# Patient Record
Sex: Male | Born: 1965
Health system: Southern US, Community
[De-identification: ages and names within clinical notes are randomized; demographics above are authoritative.]

## PROBLEM LIST (undated history)

## (undated) DIAGNOSIS — K219 Gastro-esophageal reflux disease without esophagitis: Secondary | ICD-10-CM

## (undated) DIAGNOSIS — C4491 Basal cell carcinoma of skin, unspecified: Secondary | ICD-10-CM

## (undated) DIAGNOSIS — Z973 Presence of spectacles and contact lenses: Secondary | ICD-10-CM

## (undated) HISTORY — DX: Basal cell carcinoma of skin, unspecified: C44.91

## (undated) HISTORY — PX: COLONOSCOPY: SHX174

---

## 2014-07-02 ENCOUNTER — Ambulatory Visit: Payer: Self-pay | Admitting: General Practice

## 2015-10-10 ENCOUNTER — Encounter: Payer: Self-pay | Admitting: Physician Assistant

## 2015-10-10 ENCOUNTER — Ambulatory Visit: Payer: Self-pay | Admitting: Physician Assistant

## 2015-10-10 VITALS — BP 110/70 | HR 65 | Temp 98.7°F | Ht 73.0 in | Wt 208.0 lb

## 2015-10-10 DIAGNOSIS — Z0000 Encounter for general adult medical examination without abnormal findings: Secondary | ICD-10-CM

## 2015-10-10 NOTE — Progress Notes (Signed)
S-  Patient for annual physical exam. No compliants. O-  HEENT unremarkable, Neck supple w/o adenopathy.  Lungs CTA, and Heart RRR. Abdomen with normoactive BS.Marland Kitchen Soft non-tender to palpation.  No cervical or lumbar spine deformity.  F/E ROM all extremities. CM-ll-Xll grossly intact. A.  Well exam.  P. Follow up post lab test.

## 2015-10-10 NOTE — Addendum Note (Signed)
Addended by: Rudene Anda T on: 10/10/2015 10:32 AM   Modules accepted: Orders

## 2015-10-11 LAB — CMP12+LP+TP+TSH+6AC+PSA+CBC…
A/G RATIO: 2.6 — AB (ref 1.1–2.5)
ALT: 14 IU/L (ref 0–44)
AST: 20 IU/L (ref 0–40)
Albumin: 4.5 g/dL (ref 3.5–5.5)
Alkaline Phosphatase: 63 IU/L (ref 39–117)
BASOS: 0 %
BILIRUBIN TOTAL: 0.3 mg/dL (ref 0.0–1.2)
BUN/Creatinine Ratio: 17 (ref 9–20)
BUN: 20 mg/dL (ref 6–24)
Basophils Absolute: 0 10*3/uL (ref 0.0–0.2)
CHOL/HDL RATIO: 3.6 ratio (ref 0.0–5.0)
CREATININE: 1.15 mg/dL (ref 0.76–1.27)
Calcium: 9.2 mg/dL (ref 8.7–10.2)
Chloride: 101 mmol/L (ref 96–106)
Cholesterol, Total: 203 mg/dL — ABNORMAL HIGH (ref 100–199)
EOS (ABSOLUTE): 0.2 10*3/uL (ref 0.0–0.4)
Eos: 3 %
Estimated CHD Risk: 0.6 times avg. (ref 0.0–1.0)
Free Thyroxine Index: 1.8 (ref 1.2–4.9)
GFR, EST AFRICAN AMERICAN: 85 mL/min/{1.73_m2} (ref >59)
GFR, EST NON AFRICAN AMERICAN: 73 mL/min/{1.73_m2} (ref >59)
GGT: 14 IU/L (ref 0–65)
GLUCOSE: 92 mg/dL (ref 65–99)
Globulin, Total: 1.7 g/dL (ref 1.5–4.5)
HDL: 56 mg/dL (ref >39)
HEMATOCRIT: 44.9 % (ref 37.5–51.0)
Hemoglobin: 14.9 g/dL (ref 12.6–17.7)
IMMATURE GRANS (ABS): 0 10*3/uL (ref 0.0–0.1)
Immature Granulocytes: 0 %
Iron: 68 ug/dL (ref 38–169)
LDH: 182 IU/L (ref 121–224)
LDL Calculated: 128 mg/dL — ABNORMAL HIGH (ref 0–99)
LYMPHS: 21 %
Lymphocytes Absolute: 1.3 10*3/uL (ref 0.7–3.1)
MCH: 27.6 pg (ref 26.6–33.0)
MCHC: 33.2 g/dL (ref 31.5–35.7)
MCV: 83 fL (ref 79–97)
MONOCYTES: 7 %
Monocytes Absolute: 0.5 10*3/uL (ref 0.1–0.9)
Neutrophils Absolute: 4.4 10*3/uL (ref 1.4–7.0)
Neutrophils: 69 %
PHOSPHORUS: 3.3 mg/dL (ref 2.5–4.5)
POTASSIUM: 4.9 mmol/L (ref 3.5–5.2)
Platelets: 205 10*3/uL (ref 150–379)
Prostate Specific Ag, Serum: 0.5 ng/mL (ref 0.0–4.0)
RBC: 5.4 x10E6/uL (ref 4.14–5.80)
RDW: 13.6 % (ref 12.3–15.4)
SODIUM: 142 mmol/L (ref 134–144)
T3 Uptake Ratio: 32 % (ref 24–39)
T4, Total: 5.7 ug/dL (ref 4.5–12.0)
TSH: 3.7 u[IU]/mL (ref 0.450–4.500)
Total Protein: 6.2 g/dL (ref 6.0–8.5)
Triglycerides: 95 mg/dL (ref 0–149)
URIC ACID: 6 mg/dL (ref 3.7–8.6)
VLDL CHOLESTEROL CAL: 19 mg/dL (ref 5–40)
WBC: 6.4 10*3/uL (ref 3.4–10.8)

## 2015-10-11 LAB — VITAMIN D 25 HYDROXY (VIT D DEFICIENCY, FRACTURES): Vit D, 25-Hydroxy: 29 ng/mL — ABNORMAL LOW (ref 30.0–100.0)

## 2015-10-13 ENCOUNTER — Telehealth: Payer: Self-pay | Admitting: Gastroenterology

## 2015-10-13 NOTE — Telephone Encounter (Signed)
colonoscopy

## 2015-10-16 ENCOUNTER — Encounter: Payer: Self-pay | Admitting: Emergency Medicine

## 2015-10-16 NOTE — Progress Notes (Signed)
A letter was sent to patient about his lab results and a referral for a routine colonoscopy was sent to [facility] to see Dr Allen Norris.

## 2015-10-24 ENCOUNTER — Telehealth: Payer: Self-pay

## 2015-10-24 ENCOUNTER — Other Ambulatory Visit: Payer: Self-pay

## 2015-10-24 DIAGNOSIS — Z1211 Encounter for screening for malignant neoplasm of colon: Secondary | ICD-10-CM

## 2015-10-24 MED ORDER — NA SULFATE-K SULFATE-MG SULF 17.5-3.13-1.6 GM/177ML PO SOLN: 1.0000 | ORAL | Status: DC

## 2015-10-24 NOTE — Telephone Encounter (Signed)
Gastroenterology Pre-Procedure Review  Request Date: 10/30/15 Requesting Physician: Dr. Rosanna Randy  PATIENT REVIEW QUESTIONS: The patient responded to the following health history questions as indicated:    1. Are you having any GI issues? no 2. Do you have a personal history of Polyps? yes (hyperplasic) 3. Do you have a family history of Colon Cancer or Polyps? no 4. Diabetes Mellitus? no 5. Joint replacements in the past 12 months?no 6. Major health problems in the past 3 months?no 7. Any artificial heart valves, MVP, or defibrillator?no    MEDICATIONS & ALLERGIES:    Patient reports the following regarding taking any anticoagulation/antiplatelet therapy:   Plavix, Coumadin, Eliquis, Xarelto, Lovenox, Pradaxa, Brilinta, or Effient? no Aspirin? no  Patient confirms/reports the following medications:  No current outpatient prescriptions on file.   No current facility-administered medications for this visit.    Patient confirms/reports the following allergies:  Allergies  Allergen Reactions  . Sulfa Antibiotics     No orders of the defined types were placed in this encounter.    AUTHORIZATION INFORMATION Primary Insurance: 1D#: Group #:  Secondary Insurance: 1D#: Group #:  SCHEDULE INFORMATION: Date: 10/30/15 Time: Location: [place]

## 2015-10-24 NOTE — Telephone Encounter (Signed)
Pt is scheduled for a screening colonoscopy at [facility] on 10/31/15. Instructions have been emailed and rx sent to pharmacy. Please get precert for procedure. Thank you!

## 2015-10-27 ENCOUNTER — Encounter: Payer: Self-pay | Admitting: *Deleted

## 2015-10-29 NOTE — Discharge Instructions (Signed)

## 2015-10-30 ENCOUNTER — Encounter: Payer: Self-pay | Admitting: *Deleted

## 2015-10-30 ENCOUNTER — Ambulatory Visit
Admission: RE | Admit: 2015-10-30 | Discharge: 2015-10-30 | Disposition: A | Discharge status: Home / Self Care | Payer: Managed Care, Other (non HMO) | Source: Ambulatory Visit | Attending: Gastroenterology | Admitting: Gastroenterology

## 2015-10-30 ENCOUNTER — Ambulatory Visit: Payer: Managed Care, Other (non HMO) | Admitting: Anesthesiology

## 2015-10-30 ENCOUNTER — Encounter
Admission: RE | Disposition: A | Discharge status: Home / Self Care | Payer: Self-pay | Source: Ambulatory Visit | Attending: Gastroenterology

## 2015-10-30 DIAGNOSIS — D120 Benign neoplasm of cecum: Secondary | ICD-10-CM | POA: Insufficient documentation

## 2015-10-30 DIAGNOSIS — Z1211 Encounter for screening for malignant neoplasm of colon: Secondary | ICD-10-CM | POA: Insufficient documentation

## 2015-10-30 DIAGNOSIS — D124 Benign neoplasm of descending colon: Secondary | ICD-10-CM | POA: Diagnosis not present

## 2015-10-30 DIAGNOSIS — Z79899 Other long term (current) drug therapy: Secondary | ICD-10-CM | POA: Insufficient documentation

## 2015-10-30 DIAGNOSIS — K219 Gastro-esophageal reflux disease without esophagitis: Secondary | ICD-10-CM | POA: Insufficient documentation

## 2015-10-30 DIAGNOSIS — K635 Polyp of colon: Secondary | ICD-10-CM | POA: Insufficient documentation

## 2015-10-30 DIAGNOSIS — Z882 Allergy status to sulfonamides status: Secondary | ICD-10-CM | POA: Insufficient documentation

## 2015-10-30 DIAGNOSIS — F17200 Nicotine dependence, unspecified, uncomplicated: Secondary | ICD-10-CM | POA: Diagnosis not present

## 2015-10-30 DIAGNOSIS — D125 Benign neoplasm of sigmoid colon: Secondary | ICD-10-CM | POA: Diagnosis not present

## 2015-10-30 HISTORY — PX: COLONOSCOPY WITH PROPOFOL: SHX5780

## 2015-10-30 HISTORY — PX: POLYPECTOMY: SHX5525

## 2015-10-30 HISTORY — DX: Presence of spectacles and contact lenses: Z97.3

## 2015-10-30 HISTORY — DX: Gastro-esophageal reflux disease without esophagitis: K21.9

## 2015-10-30 SURGERY — COLONOSCOPY WITH PROPOFOL
Anesthesia: Monitor Anesthesia Care | Wound class: Contaminated

## 2015-10-30 MED ORDER — PROPOFOL 10 MG/ML IV BOLUS
INTRAVENOUS | Status: DC | PRN
  Administered 2015-10-30: 20 mg via INTRAVENOUS
  Administered 2015-10-30: 80 mg via INTRAVENOUS
  Administered 2015-10-30: 20 mg via INTRAVENOUS
  Administered 2015-10-30: 30 mg via INTRAVENOUS
  Administered 2015-10-30: 20 mg via INTRAVENOUS
  Administered 2015-10-30: 40 mg via INTRAVENOUS
  Administered 2015-10-30: 20 mg via INTRAVENOUS

## 2015-10-30 MED ORDER — LACTATED RINGERS IV SOLN
INTRAVENOUS | Status: DC
  Administered 2015-10-30: 11:00:00 via INTRAVENOUS

## 2015-10-30 MED ORDER — STERILE WATER FOR IRRIGATION IR SOLN
Status: DC | PRN
  Administered 2015-10-30: 11:00:00

## 2015-10-30 MED ORDER — LIDOCAINE HCL (CARDIAC) 20 MG/ML IV SOLN
INTRAVENOUS | Status: DC | PRN
  Administered 2015-10-30: 50 mg via INTRAVENOUS

## 2015-10-30 SURGICAL SUPPLY — 28 items
CANISTER SUCT 1200ML W/VALVE (MISCELLANEOUS) ×4 IMPLANT
FCP ESCP3.2XJMB 240X2.8X (MISCELLANEOUS)
FORCEPS BIOP RAD 4 LRG CAP 4 (CUTTING FORCEPS) ×4 IMPLANT
FORCEPS BIOP RJ4 240 W/NDL (MISCELLANEOUS)
FORCEPS ESCP3.2XJMB 240X2.8X (MISCELLANEOUS) IMPLANT
GOWN CVR UNV OPN BCK APRN NK (MISCELLANEOUS) ×4 IMPLANT
GOWN ISOL THUMB LOOP REG UNIV (MISCELLANEOUS) ×4
HEMOCLIP INSTINCT (CLIP) IMPLANT
INJECTOR VARIJECT VIN23 (MISCELLANEOUS) IMPLANT
KIT CO2 TUBING (TUBING) IMPLANT
KIT DEFENDO VALVE AND CONN (KITS) IMPLANT
KIT ENDO PROCEDURE OLY (KITS) ×4 IMPLANT
LIGATOR MULTIBAND 6SHOOTER MBL (MISCELLANEOUS) IMPLANT
MARKER SPOT ENDO TATTOO 5ML (MISCELLANEOUS) IMPLANT
PAD GROUND ADULT SPLIT (MISCELLANEOUS) IMPLANT
SNARE SHORT THROW 13M SML OVAL (MISCELLANEOUS) IMPLANT
SNARE SHORT THROW 30M LRG OVAL (MISCELLANEOUS) IMPLANT
SPOT EX ENDOSCOPIC TATTOO (MISCELLANEOUS)
SUCTION POLY TRAP 4CHAMBER (MISCELLANEOUS) IMPLANT
TRAP SUCTION POLY (MISCELLANEOUS) IMPLANT
TUBING CONN 6MMX3.1M (TUBING)
TUBING SUCTION CONN 0.25 STRL (TUBING) IMPLANT
UNDERPAD 30X60 958B10 (PK) (MISCELLANEOUS) IMPLANT
VALVE BIOPSY ENDO (VALVE) IMPLANT
VARIJECT INJECTOR VIN23 (MISCELLANEOUS)
WATER AUXILLARY (MISCELLANEOUS) IMPLANT
WATER STERILE IRR 250ML POUR (IV SOLUTION) ×4 IMPLANT
WATER STERILE IRR 500ML POUR (IV SOLUTION) IMPLANT

## 2015-10-30 NOTE — Anesthesia Postprocedure Evaluation (Signed)
Anesthesia Post Note  Patient: Caleb Reyes  Procedure(s) Performed: Procedure(s) (LRB): COLONOSCOPY WITH PROPOFOL (N/A) POLYPECTOMY  Patient location during evaluation: PACU Anesthesia Type: MAC Level of consciousness: awake and alert and oriented Pain management: satisfactory to patient Vital Signs Assessment: post-procedure vital signs reviewed and stable Respiratory status: spontaneous breathing, nonlabored ventilation and respiratory function stable Cardiovascular status: blood pressure returned to baseline and stable Postop Assessment: Adequate PO intake and No signs of nausea or vomiting Anesthetic complications: no    Raliegh Ip

## 2015-10-30 NOTE — H&P (Signed)
  [facility]  [address] [place], [place] [address] Phone: [phone] Fax : [phone]  Primary Care Physician:  No primary care provider on file. Primary Gastroenterologist:  Dr. Allen Norris  Pre-Procedure History & Physical: HPI:  Caleb Reyes is a 51 y.o. male is here for a screening colonoscopy.   Past Medical History  Diagnosis Date  . GERD (gastroesophageal reflux disease)     no issues in over 5 yrs  . Wears contact lenses     Past Surgical History  Procedure Laterality Date  . Colonoscopy      Prior to Admission medications   Medication Sig Start Date End Date Taking? Authorizing Provider  FIBER PO Take by mouth daily.   Yes Historical Provider, MD  Multiple Vitamin (MULTIVITAMIN) tablet Take 1 tablet by mouth daily.   Yes Historical Provider, MD  Nutritional Supplements (JUICE PLUS FIBRE PO) Take by mouth daily.   Yes Historical Provider, MD  Na Sulfate-K Sulfate-Mg Sulf (SUPREP BOWEL PREP) SOLN Take 1 kit by mouth as directed. 10/24/15   Lucilla Lame, MD    Allergies as of 10/24/2015 - Review Complete 10/10/2015  Allergen Reaction Noted  . Sulfa antibiotics  10/24/2015    History reviewed. No pertinent family history.  Social History   Social History  . Marital Status: Married    Spouse Name: N/A  . Number of Children: N/A  . Years of Education: N/A   Occupational History  . Not on file.   Social History Main Topics  . Smoking status: Current Some Day Smoker    Types: Cigars  . Smokeless tobacco: Not on file     Comment: 1-2 cigars/wk  . Alcohol Use: 4.2 oz/week    0 Standard drinks or equivalent, 7 Cans of beer per week  . Drug Use: Not on file  . Sexual Activity: Not on file   Other Topics Concern  . Not on file   Social History Narrative    Review of Systems: See HPI, otherwise negative ROS  Physical Exam: Ht '6\' 1"'$  (1.854 m)  Wt 203 lb (92.08 kg)  BMI 26.79 kg/m2 General:   Alert,  pleasant and cooperative in  NAD Head:  Normocephalic and atraumatic. Neck:  Supple; no masses or thyromegaly. Lungs:  Clear throughout to auscultation.    Heart:  Regular rate and rhythm. Abdomen:  Soft, nontender and nondistended. Normal bowel sounds, without guarding, and without rebound.   Neurologic:  Alert and  oriented x4;  grossly normal neurologically.  Impression/Plan: Caleb Reyes is now here to undergo a screening colonoscopy.  Risks, benefits, and alternatives regarding colonoscopy have been reviewed with the patient.  Questions have been answered.  All parties agreeable.

## 2015-10-30 NOTE — Anesthesia Preprocedure Evaluation (Signed)
Anesthesia Evaluation  Patient identified by MRN, date of birth, ID band  Reviewed: Allergy & Precautions, H&P , NPO status , Patient's Chart, lab work & pertinent test results  Airway Mallampati: II  TM Distance: >3 FB Neck ROM: full    Dental no notable dental hx.    Pulmonary Current Smoker,    Pulmonary exam normal        Cardiovascular  Rhythm:regular Rate:Normal     Neuro/Psych    GI/Hepatic GERD  ,  Endo/Other    Renal/GU      Musculoskeletal   Abdominal   Peds  Hematology   Anesthesia Other Findings   Reproductive/Obstetrics                             Anesthesia Physical Anesthesia Plan  ASA: II  Anesthesia Plan: MAC   Post-op Pain Management:    Induction:   Airway Management Planned:   Additional Equipment:   Intra-op Plan:   Post-operative Plan:   Informed Consent: I have reviewed the patients History and Physical, chart, labs and discussed the procedure including the risks, benefits and alternatives for the proposed anesthesia with the patient or authorized representative who has indicated his/her understanding and acceptance.     Plan Discussed with: CRNA  Anesthesia Plan Comments:         Anesthesia Quick Evaluation

## 2015-10-30 NOTE — Transfer of Care (Signed)
Immediate Anesthesia Transfer of Care Note  Patient: Caleb Reyes  Procedure(s) Performed: Procedure(s) with comments: COLONOSCOPY WITH PROPOFOL (N/A) - PER PT AND GINGER PT WANTS TO ARRIVE AFTER 10 POLYPECTOMY  Patient Location: PACU  Anesthesia Type: MAC  Level of Consciousness: awake, alert  and patient cooperative  Airway and Oxygen Therapy: Patient Spontanous Breathing and Patient connected to supplemental oxygen  Post-op Assessment: Post-op Vital signs reviewed, Patient's Cardiovascular Status Stable, Respiratory Function Stable, Patent Airway and No signs of Nausea or vomiting  Post-op Vital Signs: Reviewed and stable  Complications: No apparent anesthesia complications

## 2015-10-30 NOTE — Anesthesia Procedure Notes (Signed)
Procedure Name: MAC Performed by: Ethyl Vila Pre-anesthesia Checklist: Patient identified, Emergency Drugs available, Suction available, Timeout performed and Patient being monitored Patient Re-evaluated:Patient Re-evaluated prior to inductionOxygen Delivery Method: Nasal cannula Placement Confirmation: positive ETCO2       

## 2015-10-30 NOTE — Op Note (Signed)
[facility] Patient Name: Caleb Reyes Procedure Date: 10/30/2015 11:06 AM MRN: [id number] Account #: [id number] Date of Birth: [birth year] Admit Type: Outpatient Age: 50 Room: [facility] OR ROOM 01 Gender: Male Note Status: Finalized Procedure:            Colonoscopy Indications:          Screening for colorectal malignant neoplasm Providers:            Lucilla Lame, MD Medicines:            Propofol per Anesthesia Complications:        No immediate complications. Procedure:            Pre-Anesthesia Assessment:                       - Prior to the procedure, a History and Physical was                        performed, and patient medications and allergies were                        reviewed. The patient's tolerance of previous                        anesthesia was also reviewed. The risks and benefits of                        the procedure and the sedation options and risks were                        discussed with the patient. All questions were                        answered, and informed consent was obtained. Prior                        Anticoagulants: The patient has taken no previous                        anticoagulant or antiplatelet agents. ASA Grade                        Assessment: II - A patient with mild systemic disease.                        After reviewing the risks and benefits, the patient was                        deemed in satisfactory condition to undergo the                        procedure.                       After obtaining informed consent, the colonoscope was                        passed under direct vision. Throughout the procedure,                        the patient's blood pressure, pulse,  and oxygen                        saturations were monitored continuously. The Olympus                        CF-HQ190L Colonoscope (S#. [id number]) was introduced                        through the anus and advanced to the the  cecum,                        identified by appendiceal orifice and ileocecal valve.                        The colonoscopy was performed without difficulty. The                        patient tolerated the procedure well. The quality of                        the bowel preparation was excellent. Findings:      The perianal and digital rectal examinations were normal.      A 3 mm polyp was found in the cecum. The polyp was sessile. The polyp       was removed with a cold biopsy forceps. Resection and retrieval were       complete.      A 3 mm polyp was found in the sigmoid colon. The polyp was sessile. The       polyp was removed with a cold biopsy forceps. Resection and retrieval       were complete.      A 5 mm polyp was found in the descending colon. The polyp was sessile.       The polyp was removed with a cold biopsy forceps. Resection and       retrieval were complete.      A 9 mm polyp was found in the descending colon. The polyp was sessile.       The polyp was removed with a cold snare. Resection and retrieval were       complete. Impression:           - One 3 mm polyp in the cecum, removed with a cold                        biopsy forceps. Resected and retrieved.                       - One 3 mm polyp in the sigmoid colon, removed with a                        cold biopsy forceps. Resected and retrieved.                       - One 5 mm polyp in the descending colon, removed with                        a cold biopsy forceps. Resected and retrieved.                       -  One 9 mm polyp in the descending colon, removed with                        a cold snare. Resected and retrieved. Recommendation:       - Await pathology results.                       - Repeat colonoscopy in 5 years if polyp adenoma and 10                        years if hyperplastic Procedure Code(s):    --- Professional ---                       [phone], Colonoscopy, flexible; with removal of tumor(s),                         polyp(s), or other lesion(s) by snare technique                       45380, 14, Colonoscopy, flexible; with biopsy, single                        or multiple Diagnosis Code(s):    --- Professional ---                       Z12.11, Encounter for screening for malignant neoplasm                        of colon                       D12.0, Benign neoplasm of cecum                       D12.5, Benign neoplasm of sigmoid colon                       D12.4, Benign neoplasm of descending colon CPT copyright 2016 American Medical Association. All rights reserved. The codes documented in this report are preliminary and upon coder review may  be revised to meet current compliance requirements. Lucilla Lame, MD 10/30/2015 11:29:36 AM This report has been signed electronically. Number of Addenda: 0 Note Initiated On: 10/30/2015 11:06 AM Scope Withdrawal Time: 0 hours 6 minutes 42 seconds  Total Procedure Duration: 0 hours 9 minutes 31 seconds       [facility]

## 2015-10-31 ENCOUNTER — Encounter: Payer: Self-pay | Admitting: Gastroenterology

## 2015-11-04 ENCOUNTER — Encounter: Payer: Self-pay | Admitting: Gastroenterology

## 2015-12-05 ENCOUNTER — Encounter: Payer: Self-pay | Admitting: Gastroenterology

## 2018-03-21 DIAGNOSIS — Z0000 Encounter for general adult medical examination without abnormal findings: Secondary | ICD-10-CM | POA: Diagnosis not present

## 2018-10-06 DIAGNOSIS — L570 Actinic keratosis: Secondary | ICD-10-CM | POA: Diagnosis not present

## 2018-10-06 DIAGNOSIS — L821 Other seborrheic keratosis: Secondary | ICD-10-CM | POA: Diagnosis not present

## 2018-10-06 DIAGNOSIS — C44519 Basal cell carcinoma of skin of other part of trunk: Secondary | ICD-10-CM | POA: Diagnosis not present

## 2018-10-06 DIAGNOSIS — C4491 Basal cell carcinoma of skin, unspecified: Secondary | ICD-10-CM

## 2018-10-06 DIAGNOSIS — L814 Other melanin hyperpigmentation: Secondary | ICD-10-CM | POA: Diagnosis not present

## 2018-10-06 DIAGNOSIS — D229 Melanocytic nevi, unspecified: Secondary | ICD-10-CM | POA: Diagnosis not present

## 2018-10-06 HISTORY — DX: Basal cell carcinoma of skin, unspecified: C44.91

## 2019-03-15 DIAGNOSIS — Z20828 Contact with and (suspected) exposure to other viral communicable diseases: Secondary | ICD-10-CM | POA: Diagnosis not present

## 2019-07-27 DIAGNOSIS — Z7689 Persons encountering health services in other specified circumstances: Secondary | ICD-10-CM | POA: Diagnosis not present

## 2019-07-27 DIAGNOSIS — Z23 Encounter for immunization: Secondary | ICD-10-CM | POA: Diagnosis not present

## 2019-07-27 DIAGNOSIS — F341 Dysthymic disorder: Secondary | ICD-10-CM | POA: Diagnosis not present

## 2019-08-14 DIAGNOSIS — Z20828 Contact with and (suspected) exposure to other viral communicable diseases: Secondary | ICD-10-CM | POA: Diagnosis not present

## 2019-08-14 DIAGNOSIS — Z833 Family history of diabetes mellitus: Secondary | ICD-10-CM | POA: Diagnosis not present

## 2019-08-14 DIAGNOSIS — Z83438 Family history of other disorder of lipoprotein metabolism and other lipidemia: Secondary | ICD-10-CM | POA: Diagnosis not present

## 2019-08-14 DIAGNOSIS — Z131 Encounter for screening for diabetes mellitus: Secondary | ICD-10-CM | POA: Diagnosis not present

## 2019-08-14 DIAGNOSIS — Z0000 Encounter for general adult medical examination without abnormal findings: Secondary | ICD-10-CM | POA: Diagnosis not present

## 2019-09-05 DIAGNOSIS — Z23 Encounter for immunization: Secondary | ICD-10-CM | POA: Diagnosis not present

## 2019-10-03 DIAGNOSIS — Z23 Encounter for immunization: Secondary | ICD-10-CM | POA: Diagnosis not present

## 2019-11-09 DIAGNOSIS — D1801 Hemangioma of skin and subcutaneous tissue: Secondary | ICD-10-CM | POA: Diagnosis not present

## 2019-11-09 DIAGNOSIS — Z1283 Encounter for screening for malignant neoplasm of skin: Secondary | ICD-10-CM | POA: Diagnosis not present

## 2019-11-09 DIAGNOSIS — L814 Other melanin hyperpigmentation: Secondary | ICD-10-CM | POA: Diagnosis not present

## 2019-11-09 DIAGNOSIS — C4441 Basal cell carcinoma of skin of scalp and neck: Secondary | ICD-10-CM | POA: Diagnosis not present

## 2019-11-09 DIAGNOSIS — Z85828 Personal history of other malignant neoplasm of skin: Secondary | ICD-10-CM | POA: Diagnosis not present

## 2019-11-30 ENCOUNTER — Other Ambulatory Visit: Payer: Self-pay

## 2019-11-30 ENCOUNTER — Ambulatory Visit: Payer: BC Managed Care – PPO | Admitting: Dermatology

## 2019-11-30 DIAGNOSIS — C4441 Basal cell carcinoma of skin of scalp and neck: Secondary | ICD-10-CM | POA: Diagnosis not present

## 2019-11-30 MED ORDER — MUPIROCIN 2 % EX OINT: 1.0000 "application " | TOPICAL_OINTMENT | Freq: Every day | CUTANEOUS | 1 refills | Status: DC

## 2019-11-30 NOTE — Patient Instructions (Addendum)
Wound Care Instructions  On the day following your surgery, you should begin doing daily dressing changes: 1. Remove the old dressing and discard it. 2. Cleanse the wound gently with tap water. This may be done in the shower or by placing a wet gauze pad directly on the wound and letting it soak for several minutes. 3. It is important to gently remove any dried blood from the wound in order to encourage healing. This may be done by gently rolling a moistened Q-tip on the dried blood. Do not pick at the wound. 4. If the wound should start to bleed, continue cleaning the wound, then place a moist gauze pad on the wound and hold pressure for a few minutes.  5. Make sure you then dry the skin surrounding the wound completely or the tape will not stick to the skin. Do not use cotton balls on the wound. 6. After the wound is clean and dry, apply the ointment gently with a Q-tip. 7. Cut a non-stick pad to fit the size of the wound. Lay the pad flush to the wound. If the wound is draining, you may want to reinforce it with a small amount of gauze on top of the non-stick pad for a little added compression to the area. 8. Use the tape to seal the area completely. 9. Select from the following with respect to your individual situation: 1. If your wound has been stitched closed: continue the above steps 1-8 at least daily until your sutures are removed. 2. If your wound has been left open to heal: continue steps 1-8 at least daily for the first 3-4 weeks. 10. We would like for you to take a few extra precautions for at least the next week. 1. Sleep with your head elevated on pillows if our wound is on your head. 2. Do not bend over or lift heavy items to reduce the chance of elevated blood pressure to the wound 3. Do not participate in particularly strenuous activities.   Below is a list of dressing supplies you might need.  . Cotton-tipped applicators - Q-tips . Gauze pads (2x2 and/or 4x4) - All-Purpose  Sponges . Non-stick dressing material - Telfa . Tape - Paper or Hypafix . New and clean tube of petroleum jelly - Vaseline    Comments on Post-Operative Period 1. Slight swelling and redness often appear around the wound. This is normal and will disappear within several days following the surgery. 2. The healing wound will drain a brownish-red-yellow discharge during healing. This is a normal phase of wound healing. As the wound begins to heal, the drainage may increase in amount. Again, this drainage is normal. 3. Notify us if the drainage becomes persistently bloody, excessively swollen, or intensely painful or develops a foul odor or red streaks.  4. If you should experience mild discomfort during the healing phase, you may take an aspirin-free medication such as Tylenol (acetaminophen). Notify us if the discomfort is severe or persistent. Avoid alcoholic beverages when taking pain medicine.  In Case of Wound Hemorrhage A wound hemorrhage is when the bandage suddenly becomes soaked with bright red blood and flows profusely. If this happens, sit down or lie down with your head elevated. If the wound has a dressing on it, do not remove the dressing. Apply pressure to the existing gauze. If the wound is not covered, use a gauze pad to apply pressure and continue applying the pressure for 20 minutes without peeking. DO NOT COVER THE WOUND WITH   A LARGE TOWEL OR [place] CLOTH. Release your hand from the wound site but do not remove the dressing. If the bleeding has stopped, gently clean around the wound. Leave the dressing in place for 24 hours if possible. This wait time allows the blood vessels to close off so that you do not spark a new round of bleeding by disrupting the newly clotted blood vessels with an immediate dressing change. If the bleeding does not subside, continue to hold pressure. If matters are out of your control, contact an After Hours clinic or go to the Emergency Room.  Marland Kitchenln

## 2019-11-30 NOTE — Progress Notes (Signed)
   Follow-Up Visit   Subjective  Caleb Reyes is a 54 y.o. male who presents for the following: Procedure (Excision of BCC at left neck).  The following portions of the chart were reviewed this encounter and updated as appropriate: Allergies  Meds  Problems  Med Hx  Surg Hx  Fam Hx      Review of Systems: No other skin or systemic complaints.  Objective  Well appearing patient in no apparent distress; mood and affect are within normal limits.  A focused examination was performed including face, neck. Relevant physical exam findings are noted in the Assessment and Plan.  Objective  Left Neck: Pink healing biopsy site, BX proven BCC.  Assessment & Plan  Basal cell carcinoma (BCC) of skin of neck Left Neck  Skin excision  Lesion length (cm):  1 Margin per side (cm):  0.4 Total excision diameter (cm):  1.8 Informed consent: discussed and consent obtained   Timeout: patient name, date of birth, surgical site, and procedure verified   Procedure prep:  Patient was prepped and draped in usual sterile fashion Prep type:  Chlorhexidine Anesthesia: the lesion was anesthetized in a standard fashion   Anesthetic:  1% lidocaine w/ epinephrine 1-100,000 nerve block Instrument used comment:  15c Hemostasis achieved with: pressure and electrodesiccation   Outcome: patient tolerated procedure well with no complications    Skin repair Complexity:  Complex Final length (cm):  4.7 Informed consent: discussed and consent obtained   Timeout: patient name, date of birth, surgical site, and procedure verified   Procedure prep:  Patient was prepped and draped in usual sterile fashion Prep type:  Chlorhexidine Anesthesia: the lesion was anesthetized in a standard fashion   Anesthesia comment:  13cc Anesthetic:  1% lidocaine w/ epinephrine 1-100,000 local infiltration Reason for type of repair: reduce tension to allow closure, reduce the risk of dehiscence, infection, and necrosis,  allow closure of the large defect, preserve normal anatomy, allow side-to-side closure without requiring a flap or graft and enhance both functionality and cosmetic results   Undermining: area extensively undermined   Subcutaneous layers (deep stitches):  Suture size:  3-0 Suture type: Vicryl (polyglactin 910)   Stitches:  Buried vertical mattress Fine/surface layer approximation (top stitches):  Suture size:  4-0 Suture type: Vicryl Rapide (coated polyglactin 910)   Stitches: simple running   Hemostasis achieved with: suture, pressure and electrodesiccation Hemostasis achieved with comment:  Vicryl Rapide, 3-0 Vicryl Outcome: patient tolerated procedure well with no complications   Post-procedure details: wound care instructions given   Additional details:  Extensive undermining greater than the maximum width of the defect along at least one entire edge of the defect was performed Maximum width of defect perpendicular to line of the closure 1.8 cm Width of undermining done 2.1 cm  Mupirocin and a pressure dressing applied  mupirocin ointment (BACTROBAN) 2 %  Specimen 1 - Surgical pathology Differential Diagnosis: BCC Check Margins: No Pink healing biopsy site, BX proven BCC [id number]  Return if symptoms worsen or fail to improve.   Graciella Belton, RMA, am acting as scribe for Forest Gleason, MD .  Documentation: I have reviewed the above documentation for accuracy and completeness, and I agree with the above.  Forest Gleason, MD

## 2019-12-03 ENCOUNTER — Telehealth: Payer: Self-pay

## 2019-12-03 NOTE — Telephone Encounter (Signed)
Left message to check on patient following Friday's surgery. Patient to call office if he has any questions or needs anything.

## 2019-12-05 ENCOUNTER — Encounter: Payer: Self-pay | Admitting: Dermatology

## 2019-12-05 NOTE — Progress Notes (Signed)
Skin (M), left neck RESIDUAL BASAL CELL CARCINOMA, MARGINS FREE "entire skin cancer is out" --> nothing else needs to be done. Keep 6 month full body skin exam appointment and call for any new or changing lesions before then.

## 2019-12-11 ENCOUNTER — Telehealth: Payer: Self-pay

## 2019-12-11 NOTE — Telephone Encounter (Signed)
Called patient to review results, no answer.

## 2019-12-11 NOTE — Telephone Encounter (Signed)
Unable to leave a message in regards to patient's pathology results.

## 2020-05-14 DIAGNOSIS — Z20828 Contact with and (suspected) exposure to other viral communicable diseases: Secondary | ICD-10-CM | POA: Diagnosis not present

## 2020-07-04 DIAGNOSIS — Z23 Encounter for immunization: Secondary | ICD-10-CM | POA: Diagnosis not present

## 2020-08-15 ENCOUNTER — Emergency Department: Payer: BC Managed Care – PPO

## 2020-08-15 ENCOUNTER — Encounter: Payer: Self-pay | Admitting: Emergency Medicine

## 2020-08-15 ENCOUNTER — Emergency Department
Admission: EM | Admit: 2020-08-15 | Discharge: 2020-08-15 | Disposition: A | Discharge status: Home / Self Care | Payer: BC Managed Care – PPO | Attending: Student in an Organized Health Care Education/Training Program | Admitting: Student in an Organized Health Care Education/Training Program

## 2020-08-15 ENCOUNTER — Other Ambulatory Visit: Payer: Self-pay

## 2020-08-15 DIAGNOSIS — Z85828 Personal history of other malignant neoplasm of skin: Secondary | ICD-10-CM | POA: Insufficient documentation

## 2020-08-15 DIAGNOSIS — K625 Hemorrhage of anus and rectum: Secondary | ICD-10-CM | POA: Diagnosis not present

## 2020-08-15 DIAGNOSIS — F17290 Nicotine dependence, other tobacco product, uncomplicated: Secondary | ICD-10-CM | POA: Diagnosis not present

## 2020-08-15 LAB — TYPE AND SCREEN
ABO/RH(D): O POS
Antibody Screen: NEGATIVE

## 2020-08-15 LAB — CBC
HCT: 44.2 % (ref 39.0–52.0)
Hemoglobin: 14.3 g/dL (ref 13.0–17.0)
MCH: 26.4 pg (ref 26.0–34.0)
MCHC: 32.4 g/dL (ref 30.0–36.0)
MCV: 81.7 fL (ref 80.0–100.0)
Platelets: 226 10*3/uL (ref 150–400)
RBC: 5.41 MIL/uL (ref 4.22–5.81)
RDW: 13.2 % (ref 11.5–15.5)
WBC: 6.6 10*3/uL (ref 4.0–10.5)
nRBC: 0 % (ref 0.0–0.2)

## 2020-08-15 LAB — COMPREHENSIVE METABOLIC PANEL
ALT: 16 U/L (ref 0–44)
AST: 19 U/L (ref 15–41)
Albumin: 4.2 g/dL (ref 3.5–5.0)
Alkaline Phosphatase: 68 U/L (ref 38–126)
Anion gap: 11 (ref 5–15)
BUN: 19 mg/dL (ref 6–20)
CO2: 28 mmol/L (ref 22–32)
Calcium: 9.1 mg/dL (ref 8.9–10.3)
Chloride: 99 mmol/L (ref 98–111)
Creatinine, Ser: 1.17 mg/dL (ref 0.61–1.24)
GFR, Estimated: >60 mL/min (ref >60)
Glucose, Bld: 92 mg/dL (ref 70–99)
Potassium: 4.4 mmol/L (ref 3.5–5.1)
Sodium: 138 mmol/L (ref 135–145)
Total Bilirubin: 0.7 mg/dL (ref 0.3–1.2)
Total Protein: 6.8 g/dL (ref 6.5–8.1)

## 2020-08-15 MED ORDER — IOHEXOL 9 MG/ML PO SOLN
500.0000 mL | ORAL | Status: AC
  Administered 2020-08-15: 500 mL via ORAL

## 2020-08-15 MED ORDER — IOHEXOL 300 MG/ML  SOLN
100.0000 mL | Freq: Once | INTRAMUSCULAR | Status: AC | PRN
  Administered 2020-08-15: 100 mL via INTRAVENOUS

## 2020-08-15 NOTE — ED Notes (Signed)
Rainbow with T&S was sent to lab.

## 2020-08-15 NOTE — ED Triage Notes (Signed)
Pt to ED via POV for rectal bleeding. Pt had 1 episode of bright red blood in the toilet when he used the restroom this morning. Pt states that there was not blood in the stool or on the tissue. Pt states that BM was not painful. Pt does have hx/o polyps. Pt is in NAD.

## 2020-08-15 NOTE — ED Provider Notes (Signed)
[facility] Emergency Department Provider Note    First MD Initiated Contact with Patient 08/15/20 1510     (approximate)  I have reviewed the triage vital signs and the nursing notes.   HISTORY  Chief Complaint Rectal Bleeding    HPI Caleb Reyes is a 54 y.o. male the below listed past medical history presents to the ER for evaluation of bright red blood per rectum occurred this morning.  Denies any pain.  Does have a history of colon polyps from colonoscopy several years ago.  Is not on blood thinners.  Did not have any pain.  Has not noted any melena or hematochezia.  States he typically is regular and goes #2 twice daily.  Denies any chest pain or pressure.  No indigestion.    Past Medical History:  Diagnosis Date  . Basal cell carcinoma    left lower back  . Basal cell carcinoma    left neck  . GERD (gastroesophageal reflux disease)    no issues in over 5 yrs  . Wears contact lenses    No family history on file. Past Surgical History:  Procedure Laterality Date  . COLONOSCOPY    . COLONOSCOPY WITH PROPOFOL N/A 10/30/2015   Procedure: COLONOSCOPY WITH PROPOFOL;  Surgeon: Lucilla Lame, MD;  Location: [place];  Service: Endoscopy;  Laterality: N/A;  PER PT AND GINGER PT WANTS TO ARRIVE AFTER 10  . POLYPECTOMY  10/30/2015   Procedure: POLYPECTOMY;  Surgeon: Lucilla Lame, MD;  Location: [place];  Service: Endoscopy;;   Patient Active Problem List   Diagnosis Date Noted  . Special screening for malignant neoplasms, colon   . Benign neoplasm of descending colon   . Benign neoplasm of cecum   . Benign neoplasm of sigmoid colon       Prior to Admission medications   Medication Sig Start Date End Date Taking? Authorizing Provider  FIBER PO Take by mouth daily.    [provider]  Multiple Vitamin (MULTIVITAMIN) tablet Take 1 tablet by mouth daily.    [provider]  mupirocin ointment (BACTROBAN) 2 %  Apply 1 application topically daily. With dressing changes 11/30/19   Laurence Ferrari, [place], MD  Na Sulfate-K Sulfate-Mg Sulf (SUPREP BOWEL PREP) SOLN Take 1 kit by mouth as directed. 10/24/15   Lucilla Lame, MD  Nutritional Supplements (JUICE PLUS FIBRE PO) Take by mouth daily.    [provider]    Allergies Sulfa antibiotics    Social History Social History   Tobacco Use  . Smoking status: Current Some Day Smoker    Types: Cigars  . Smokeless tobacco: Never Used  . Tobacco comment: 1-2 cigars/wk  Substance Use Topics  . Alcohol use: Yes    Alcohol/week: 7.0 standard drinks    Types: 7 Cans of beer per week  . Drug use: Not Currently    Review of Systems Patient denies headaches, rhinorrhea, blurry vision, numbness, shortness of breath, chest pain, edema, cough, abdominal pain, nausea, vomiting, diarrhea, dysuria, fevers, rashes or hallucinations unless otherwise stated above in HPI. ____________________________________________   PHYSICAL EXAM:  VITAL SIGNS: Vitals:   08/15/20 1723 08/15/20 1730  BP: 138/90 133/81  Pulse: (!) 56 65  Resp: 16 16  Temp: 98 F (36.7 C)   SpO2: 99% 97%    Constitutional: Alert and oriented.  Eyes: Conjunctivae are normal.  Head: Atraumatic. Nose: No congestion/rhinnorhea. Mouth/Throat: Mucous membranes are moist.   Neck: No stridor. Painless ROM.  Cardiovascular: Normal rate, regular rhythm. Grossly normal heart sounds.  Good peripheral circulation. Respiratory: Normal respiratory effort.  No retractions. Lungs CTAB. Gastrointestinal: Soft and nontender. No distention. No abdominal bruits. No CVA tenderness. Genitourinary: Normal external genitalia no identifiable mass.  Stool was guaiac negative no melena hematochezia on DRE. Musculoskeletal: No lower extremity tenderness nor edema.  No joint effusions. Neurologic:  Normal speech and language. No gross focal neurologic deficits are appreciated. No facial droop Skin:  Skin is  warm, dry and intact. No rash noted. Psychiatric: Mood and affect are normal. Speech and behavior are normal.  ____________________________________________   LABS (all labs ordered are listed, but only abnormal results are displayed)  Results for orders placed or performed during the hospital encounter of 08/15/20 (from the past 24 hour(s))  Comprehensive metabolic panel     Status: None   Collection Time: 08/15/20 12:09 PM  Result Value Ref Range   Sodium 138 135 - 145 mmol/L   Potassium 4.4 3.5 - 5.1 mmol/L   Chloride 99 98 - 111 mmol/L   CO2 28 22 - 32 mmol/L   Glucose, Bld 92 70 - 99 mg/dL   BUN 19 6 - 20 mg/dL   Creatinine, Ser 1.17 0.61 - 1.24 mg/dL   Calcium 9.1 8.9 - 10.3 mg/dL   Total Protein 6.8 6.5 - 8.1 g/dL   Albumin 4.2 3.5 - 5.0 g/dL   AST 19 15 - 41 U/L   ALT 16 0 - 44 U/L   Alkaline Phosphatase 68 38 - 126 U/L   Total Bilirubin 0.7 0.3 - 1.2 mg/dL   GFR, Estimated >60 >60 mL/min   Anion gap 11 5 - 15  CBC     Status: None   Collection Time: 08/15/20 12:09 PM  Result Value Ref Range   WBC 6.6 4.0 - 10.5 K/uL   RBC 5.41 4.22 - 5.81 MIL/uL   Hemoglobin 14.3 13.0 - 17.0 g/dL   HCT 44.2 39 - 52 %   MCV 81.7 80.0 - 100.0 fL   MCH 26.4 26.0 - 34.0 pg   MCHC 32.4 30.0 - 36.0 g/dL   RDW 13.2 11.5 - 15.5 %   Platelets 226 150 - 400 K/uL   nRBC 0.0 0.0 - 0.2 %  Type and screen [place]     Status: None   Collection Time: 08/15/20 12:09 PM  Result Value Ref Range   ABO/RH(D) O POS    Antibody Screen NEG    Sample Expiration      08/18/2020,2359 Performed at [facility], [address] [place], [place] [address]    ____________________________________________ ____________________________________________  RADIOLOGY  I personally reviewed all radiographic images ordered to evaluate for the above acute complaints and reviewed radiology reports and findings.  These findings were personally discussed with the patient.  Please  see medical record for radiology report.  ____________________________________________   PROCEDURES  Procedure(s) performed:  Procedures    Critical Care performed: no ____________________________________________   INITIAL IMPRESSION / ASSESSMENT AND PLAN / ED COURSE  Pertinent labs & imaging results that were available during my care of the patient were reviewed by me and considered in my medical decision making (see chart for details).   DDX: Hemorrhoid, diverticulosis, mass, anal fissure, upper GI bleed  Caleb Reyes is a 54 y.o. who presents to the ED with presentation as described above. Patient arrives well-appearing nontoxic afebrile hemodynamically stable. He is not on any blood thinners. Is exam is reassuring  is guaiac negative no active bleeding. His history is complicated by report of polyps on previous colonoscopy given his symptoms will order CT imaging to rule out mass diverticular disease. Will observe here in the ER. His hemoglobin is stable.  Clinical Course as of Aug 15 1853  Fri Aug 15, 2020  1851 Patient reassessed. CT imaging is reassuring. Is not had any additional episodes. He is well-appearing nontoxic. With all only one episode with negative guaiac is reassuring rectal exam reassuring imaging normal hemoglobin I believe he stable and appropriate for close outpatient follow-up. Patient agreeable to plan.  Discussed strict return precautions.   [PR]    Clinical Course User Index [PR] Merlyn Lot, MD    The patient was evaluated in Emergency Department today for the symptoms described in the history of present illness. He/she was evaluated in the context of the global COVID-19 pandemic, which necessitated consideration that the patient might be at risk for infection with the SARS-CoV-2 virus that causes COVID-19. Institutional protocols and algorithms that pertain to the evaluation of patients at risk for COVID-19 are in a state of rapid change based on  information released by regulatory bodies including the CDC and federal and state organizations. These policies and algorithms were followed during the patient's care in the ED.  As part of my medical decision making, I reviewed the following data within the [place] notes reviewed and incorporated, Labs reviewed, notes from prior ED visits and [place] Controlled Substance Database   ____________________________________________   FINAL CLINICAL IMPRESSION(S) / ED DIAGNOSES  Final diagnoses:  Rectal bleeding      NEW MEDICATIONS STARTED DURING THIS VISIT:  New Prescriptions   No medications on file     Note:  This document was prepared using Dragon voice recognition software and may include unintentional dictation errors.    Merlyn Lot, MD 08/15/20 [phone]

## 2020-08-15 NOTE — ED Notes (Signed)
PO contrast completed -- CT notified

## 2020-08-18 ENCOUNTER — Other Ambulatory Visit: Payer: Self-pay

## 2020-08-18 ENCOUNTER — Telehealth (INDEPENDENT_AMBULATORY_CARE_PROVIDER_SITE_OTHER): Payer: Self-pay | Admitting: Gastroenterology

## 2020-08-18 DIAGNOSIS — Z86010 Personal history of colonic polyps: Secondary | ICD-10-CM

## 2020-08-18 MED ORDER — PEG 3350-KCL-NA BICARB-NACL 420 G PO SOLR: 4000.0000 mL | Freq: Once | ORAL | 0 refills | Status: AC

## 2020-08-18 NOTE — Progress Notes (Signed)
Gastroenterology Pre-Procedure Review  Request Date: Tuesday 09/09/20 Requesting Physician: Dr. Wohl  PATIENT REVIEW QUESTIONS: The patient responded to the following health history questions as indicated:    1. Are you having any GI issues? ER Visit 08/15/20 Rectal Bleed.  ER suspected polyp was the cause of bleed. 2. Do you have a personal history of Polyps? yes (10/30/15 colon polyps noted performed by Dr. Wohl) 3. Do you have a family history of Colon Cancer or Polyps? yes (Dad (living) colon cancer) 4. Diabetes Mellitus? no 5. Joint replacements in the past 12 months?no 6. Major health problems in the past 3 months?no 7. Any artificial heart valves, MVP, or defibrillator?no    MEDICATIONS & ALLERGIES:    Patient reports the following regarding taking any anticoagulation/antiplatelet therapy:   Plavix, Coumadin, Eliquis, Xarelto, Lovenox, Pradaxa, Brilinta, or Effient? no Aspirin? no  Patient confirms/reports the following medications:  Current Outpatient Medications  Medication Sig Dispense Refill  . FIBER PO Take by mouth daily.    . Multiple Vitamin (MULTIVITAMIN) tablet Take 1 tablet by mouth daily.    . mupirocin ointment (BACTROBAN) 2 % Apply 1 application topically daily. With dressing changes (Patient not taking: Reported on 08/18/2020) 22 g 1  . Na Sulfate-K Sulfate-Mg Sulf (SUPREP BOWEL PREP) SOLN Take 1 kit by mouth as directed. (Patient not taking: Reported on 08/18/2020) 1 Bottle 0  . Nutritional Supplements (JUICE PLUS FIBRE PO) Take by mouth daily. (Patient not taking: Reported on 08/18/2020)     No current facility-administered medications for this visit.    Patient confirms/reports the following allergies:  Allergies  Allergen Reactions  . Sulfa Antibiotics Rash    No orders of the defined types were placed in this encounter.   AUTHORIZATION INFORMATION Primary Insurance: 1D#: Group #:  Secondary Insurance: 1D#: Group #:  SCHEDULE INFORMATION:  Date: Tuesday 09/09/20 Time: Location:[facility] 

## 2020-09-05 ENCOUNTER — Other Ambulatory Visit
Admission: RE | Admit: 2020-09-05 | Discharge: 2020-09-05 | Disposition: A | Discharge status: Home / Self Care | Payer: BC Managed Care – PPO | Source: Ambulatory Visit | Attending: Gastroenterology | Admitting: Gastroenterology

## 2020-09-05 ENCOUNTER — Other Ambulatory Visit: Payer: Self-pay

## 2020-09-05 DIAGNOSIS — Z20822 Contact with and (suspected) exposure to covid-19: Secondary | ICD-10-CM | POA: Diagnosis not present

## 2020-09-05 DIAGNOSIS — Z01812 Encounter for preprocedural laboratory examination: Secondary | ICD-10-CM | POA: Insufficient documentation

## 2020-09-05 LAB — SARS CORONAVIRUS 2 (TAT 6-24 HRS): SARS Coronavirus 2: NEGATIVE

## 2020-09-08 ENCOUNTER — Encounter: Payer: Self-pay | Admitting: Gastroenterology

## 2020-09-09 ENCOUNTER — Ambulatory Visit: Payer: BC Managed Care – PPO | Admitting: Certified Registered"

## 2020-09-09 ENCOUNTER — Encounter: Payer: Self-pay | Admitting: Gastroenterology

## 2020-09-09 ENCOUNTER — Encounter
Admission: RE | Disposition: A | Discharge status: Home / Self Care | Payer: Self-pay | Source: Home / Self Care | Attending: Gastroenterology

## 2020-09-09 ENCOUNTER — Ambulatory Visit
Admission: RE | Admit: 2020-09-09 | Discharge: 2020-09-09 | Disposition: A | Discharge status: Home / Self Care | Payer: BC Managed Care – PPO | Attending: Gastroenterology | Admitting: Gastroenterology

## 2020-09-09 ENCOUNTER — Other Ambulatory Visit: Payer: Self-pay

## 2020-09-09 DIAGNOSIS — F17290 Nicotine dependence, other tobacco product, uncomplicated: Secondary | ICD-10-CM | POA: Insufficient documentation

## 2020-09-09 DIAGNOSIS — Z8719 Personal history of other diseases of the digestive system: Secondary | ICD-10-CM | POA: Diagnosis not present

## 2020-09-09 DIAGNOSIS — D125 Benign neoplasm of sigmoid colon: Secondary | ICD-10-CM | POA: Diagnosis not present

## 2020-09-09 DIAGNOSIS — Z85828 Personal history of other malignant neoplasm of skin: Secondary | ICD-10-CM | POA: Insufficient documentation

## 2020-09-09 DIAGNOSIS — Z882 Allergy status to sulfonamides status: Secondary | ICD-10-CM | POA: Diagnosis not present

## 2020-09-09 DIAGNOSIS — Z1211 Encounter for screening for malignant neoplasm of colon: Secondary | ICD-10-CM | POA: Diagnosis not present

## 2020-09-09 DIAGNOSIS — Z860100 Personal history of colon polyps, unspecified: Secondary | ICD-10-CM

## 2020-09-09 DIAGNOSIS — K635 Polyp of colon: Secondary | ICD-10-CM | POA: Diagnosis not present

## 2020-09-09 DIAGNOSIS — K648 Other hemorrhoids: Secondary | ICD-10-CM | POA: Diagnosis not present

## 2020-09-09 HISTORY — PX: COLONOSCOPY WITH PROPOFOL: SHX5780

## 2020-09-09 SURGERY — COLONOSCOPY WITH PROPOFOL
Anesthesia: General

## 2020-09-09 MED ORDER — PROPOFOL 500 MG/50ML IV EMUL
INTRAVENOUS | Status: DC | PRN
  Administered 2020-09-09: 165 ug/kg/min via INTRAVENOUS

## 2020-09-09 MED ORDER — SODIUM CHLORIDE 0.9 % IV SOLN: INTRAVENOUS | Status: DC

## 2020-09-09 MED ORDER — LIDOCAINE HCL (CARDIAC) PF 100 MG/5ML IV SOSY
PREFILLED_SYRINGE | INTRAVENOUS | Status: DC | PRN
  Administered 2020-09-09: 100 mg via INTRAVENOUS

## 2020-09-09 MED ORDER — PROPOFOL 10 MG/ML IV BOLUS
INTRAVENOUS | Status: DC | PRN
  Administered 2020-09-09 (×2): 20 mg via INTRAVENOUS
  Administered 2020-09-09: 60 mg via INTRAVENOUS

## 2020-09-09 MED ORDER — EPHEDRINE SULFATE 50 MG/ML IJ SOLN: INTRAMUSCULAR | Status: DC | PRN

## 2020-09-09 MED ORDER — GLYCOPYRROLATE 0.2 MG/ML IJ SOLN
INTRAMUSCULAR | Status: DC | PRN
  Administered 2020-09-09: .2 mg via INTRAVENOUS

## 2020-09-09 NOTE — Transfer of Care (Signed)
Immediate Anesthesia Transfer of Care Note  Patient: Caleb Reyes  Procedure(s) Performed: COLONOSCOPY WITH PROPOFOL (N/A )  Patient Location: Endoscopy Unit  Anesthesia Type:General  Level of Consciousness: drowsy, patient cooperative and responds to stimulation  Airway & Oxygen Therapy: Patient Spontanous Breathing and Patient connected to face mask oxygen  Post-op Assessment: Report given to RN and Post -op Vital signs reviewed and stable  Post vital signs: Reviewed and stable  Last Vitals:  Vitals Value Taken Time  BP    Temp    Pulse    Resp    SpO2      Last Pain:  Vitals:   09/09/20 0911  TempSrc: Temporal  PainSc: 0-No pain         Complications: No complications documented.

## 2020-09-09 NOTE — H&P (Signed)
Lucilla Lame, MD [address] [place], [place] [address] [phone] Fax : [phone]  Primary Care Physician:  Patient, No Pcp Per Primary Gastroenterologist:  Dr. Allen Norris  Pre-Procedure History & Physical: HPI:  Caleb Reyes is a 54 y.o. male is here for an colonoscopy.   Past Medical History:  Diagnosis Date  . Basal cell carcinoma    left lower back  . Basal cell carcinoma    left neck  . GERD (gastroesophageal reflux disease)    no issues in over 5 yrs  . Wears contact lenses     Past Surgical History:  Procedure Laterality Date  . COLONOSCOPY    . COLONOSCOPY WITH PROPOFOL N/A 10/30/2015   Procedure: COLONOSCOPY WITH PROPOFOL;  Surgeon: Lucilla Lame, MD;  Location: [place];  Service: Endoscopy;  Laterality: N/A;  PER PT AND GINGER PT WANTS TO ARRIVE AFTER 10  . POLYPECTOMY  10/30/2015   Procedure: POLYPECTOMY;  Surgeon: Lucilla Lame, MD;  Location: [place];  Service: Endoscopy;;    Prior to Admission medications   Medication Sig Start Date End Date Taking? Authorizing Provider  FIBER PO Take by mouth daily.   Yes [provider]  Multiple Vitamin (MULTIVITAMIN) tablet Take 1 tablet by mouth daily.   Yes [provider]  mupirocin ointment (BACTROBAN) 2 % Apply 1 application topically daily. With dressing changes Patient not taking: No sig reported 11/30/19   Laurence Ferrari, [place], MD  Na Sulfate-K Sulfate-Mg Sulf (SUPREP BOWEL PREP) SOLN Take 1 kit by mouth as directed. Patient not taking: No sig reported 10/24/15   Lucilla Lame, MD  Nutritional Supplements (JUICE PLUS FIBRE PO) Take by mouth daily. Patient not taking: No sig reported    [provider]    Allergies as of 08/18/2020 - Review Complete 08/18/2020  Allergen Reaction Noted  . Sulfa antibiotics Rash 10/24/2015    History reviewed. No pertinent family history.  Social History   Socioeconomic History  . Marital status: Married     Spouse name: Not on file  . Number of children: Not on file  . Years of education: Not on file  . Highest education level: Not on file  Occupational History  . Not on file  Tobacco Use  . Smoking status: Current Some Day Smoker    Types: Cigars  . Smokeless tobacco: Never Used  . Tobacco comment: 1-2 cigars/wk  Vaping Use  . Vaping Use: Never used  Substance and Sexual Activity  . Alcohol use: Yes    Alcohol/week: 7.0 standard drinks    Types: 7 Cans of beer per week  . Drug use: Never  . Sexual activity: Not on file  Other Topics Concern  . Not on file  Social History Narrative  . Not on file   Social Determinants of Health   Financial Resource Strain: Not on file  Food Insecurity: Not on file  Transportation Needs: Not on file  Physical Activity: Not on file  Stress: Not on file  Social Connections: Not on file  Intimate Partner Violence: Not on file    Review of Systems: See HPI, otherwise negative ROS  Physical Exam: BP (!) 135/91   Pulse (!) 58   Temp (!) 97.5 F (36.4 C) (Temporal)   Resp 18   Ht 6' 1" (1.854 m)   Wt 89.4 kg   SpO2 98%   BMI 25.99 kg/m  General:   Alert,  pleasant and cooperative in NAD Head:  Normocephalic and  atraumatic. Neck:  Supple; no masses or thyromegaly. Lungs:  Clear throughout to auscultation.    Heart:  Regular rate and rhythm. Abdomen:  Soft, nontender and nondistended. Normal bowel sounds, without guarding, and without rebound.   Neurologic:  Alert and  oriented x4;  grossly normal neurologically.  Impression/Plan: Caleb Reyes is here for an colonoscopy to be performed for a history of adenomatous polyps in 2017    Risks, benefits, limitations, and alternatives regarding  colonoscopy have been reviewed with the patient.  Questions have been answered.  All parties agreeable.   Lucilla Lame, MD  09/09/2020, 9:35 AM

## 2020-09-09 NOTE — Anesthesia Postprocedure Evaluation (Signed)
Anesthesia Post Note  Patient: Caleb Reyes  Procedure(s) Performed: COLONOSCOPY WITH PROPOFOL (N/A )  Patient location during evaluation: Endoscopy Anesthesia Type: General Level of consciousness: awake and alert Pain management: pain level controlled Vital Signs Assessment: post-procedure vital signs reviewed and stable Respiratory status: spontaneous breathing and respiratory function stable Cardiovascular status: stable Anesthetic complications: no   No complications documented.   Last Vitals:  Vitals:   09/09/20 1020 09/09/20 1030  BP: 130/86 130/86  Pulse: 69 (!) 59  Resp: 20 11  Temp:    SpO2: 97% 100%    Last Pain:  Vitals:   09/09/20 1000  TempSrc: Temporal  PainSc:                  Caleb Reyes

## 2020-09-09 NOTE — Op Note (Signed)
[facility] Gastroenterology Patient Name: Caleb Reyes Procedure Date: 09/09/2020 9:38 AM MRN: [id number] Account #: [id number] Date of Birth: Nov 16, 1965 Admit Type: Outpatient Age: 54 Room: [facility] ENDO ROOM 4 Gender: Male Note Status: Finalized Procedure:             Colonoscopy Indications:           High risk colon cancer surveillance: Personal history                         of colonic polyps Providers:             Midge Minium MD, MD Referring MD:          No Local Md, MD (Referring MD) Medicines:             Propofol per Anesthesia Complications:         No immediate complications. Procedure:             Pre-Anesthesia Assessment:                        - Prior to the procedure, a History and Physical was                         performed, and patient medications and allergies were                         reviewed. The patient's tolerance of previous                         anesthesia was also reviewed. The risks and benefits                         of the procedure and the sedation options and risks                         were discussed with the patient. All questions were                         answered, and informed consent was obtained. Prior                         Anticoagulants: The patient has taken no previous                         anticoagulant or antiplatelet agents. ASA Grade                         Assessment: II - A patient with mild systemic disease.                         After reviewing the risks and benefits, the patient                         was deemed in satisfactory condition to undergo the                         procedure.  After obtaining informed consent, the colonoscope was                         passed under direct vision. Throughout the procedure,                         the patient's blood pressure, pulse, and oxygen                         saturations were monitored continuously. The                          Colonoscope was introduced through the anus and                         advanced to the the cecum, identified by appendiceal                         orifice and ileocecal valve. The colonoscopy was                         performed without difficulty. The patient tolerated                         the procedure well. The quality of the bowel                         preparation was excellent. Findings:      The perianal and digital rectal examinations were normal.      A 7 mm polyp was found in the sigmoid colon. The polyp was pedunculated.       The polyp was removed with a hot snare. Resection and retrieval were       complete.      Non-bleeding internal hemorrhoids were found during retroflexion. The       hemorrhoids were Grade I (internal hemorrhoids that do not prolapse). Impression:            - One 7 mm polyp in the sigmoid colon, removed with a                         hot snare. Resected and retrieved.                        - Non-bleeding internal hemorrhoids. Recommendation:        - Discharge patient to home.                        - Resume previous diet.                        - Continue present medications.                        - Await pathology results.                        - Repeat colonoscopy in 5 years for surveillance. Procedure Code(s):     --- Professional ---  45385, Colonoscopy, flexible; with removal of                         tumor(s), polyp(s), or other lesion(s) by snare                         technique Diagnosis Code(s):     --- Professional ---                        Z86.010, Personal history of colonic polyps                        K63.5, Polyp of colon CPT copyright 2019 American Medical Association. All rights reserved. The codes documented in this report are preliminary and upon coder review may  be revised to meet current compliance requirements. Lucilla Lame MD, MD 09/09/2020 10:02:21 AM This report has been  signed electronically. Number of Addenda: 0 Note Initiated On: 09/09/2020 9:38 AM Scope Withdrawal Time: 0 hours 7 minutes 55 seconds  Total Procedure Duration: 0 hours 18 minutes 18 seconds  Estimated Blood Loss:  Estimated blood loss: none.      [facility]

## 2020-09-09 NOTE — Anesthesia Preprocedure Evaluation (Signed)
Anesthesia Evaluation  Patient identified by MRN, date of birth, ID band Patient awake    Reviewed: Allergy & Precautions, NPO status , Patient's Chart, lab work & pertinent test results  History of Anesthesia Complications Negative for: history of anesthetic complications  Airway Mallampati: II       Dental   Pulmonary neg sleep apnea, neg COPD, Current Smoker,           Cardiovascular (-) hypertension(-) Past MI and (-) CHF (-) dysrhythmias (-) Valvular Problems/Murmurs     Neuro/Psych neg Seizures    GI/Hepatic Neg liver ROS, GERD (rare)  ,  Endo/Other  neg diabetes  Renal/GU negative Renal ROS     Musculoskeletal   Abdominal   Peds  Hematology   Anesthesia Other Findings   Reproductive/Obstetrics                            Anesthesia Physical Anesthesia Plan  ASA: II  Anesthesia Plan: General   Post-op Pain Management:    Induction: Intravenous  PONV Risk Score and Plan:   Airway Management Planned: Nasal Cannula  Additional Equipment:   Intra-op Plan:   Post-operative Plan:   Informed Consent: I have reviewed the patients History and Physical, chart, labs and discussed the procedure including the risks, benefits and alternatives for the proposed anesthesia with the patient or authorized representative who has indicated his/her understanding and acceptance.       Plan Discussed with:   Anesthesia Plan Comments:         Anesthesia Quick Evaluation

## 2020-09-09 NOTE — Anesthesia Procedure Notes (Signed)
Procedure Name: General with mask airway Performed by: Fletcher-Harrison, Orel Hord, CRNA Pre-anesthesia Checklist: Patient identified, Emergency Drugs available, Suction available and Patient being monitored Patient Re-evaluated:Patient Re-evaluated prior to induction Oxygen Delivery Method: Simple face mask Induction Type: IV induction Placement Confirmation: positive ETCO2 and CO2 detector Dental Injury: Teeth and Oropharynx as per pre-operative assessment        

## 2020-09-10 ENCOUNTER — Encounter: Payer: Self-pay | Admitting: Gastroenterology

## 2020-09-10 LAB — SURGICAL PATHOLOGY

## 2020-09-11 ENCOUNTER — Encounter: Payer: Self-pay | Admitting: Gastroenterology

## 2020-09-15 DIAGNOSIS — Z20822 Contact with and (suspected) exposure to covid-19: Secondary | ICD-10-CM | POA: Diagnosis not present

## 2020-09-15 DIAGNOSIS — Z03818 Encounter for observation for suspected exposure to other biological agents ruled out: Secondary | ICD-10-CM | POA: Diagnosis not present

## 2020-09-17 DIAGNOSIS — Z20822 Contact with and (suspected) exposure to covid-19: Secondary | ICD-10-CM | POA: Diagnosis not present

## 2020-09-17 DIAGNOSIS — Z03818 Encounter for observation for suspected exposure to other biological agents ruled out: Secondary | ICD-10-CM | POA: Diagnosis not present

## 2021-02-12 IMAGING — CT CT ABD-PELV W/ CM
2 of 5 series · 16 of 46 positions shown, 18 images · IV contrast (APPLIED)
Comparison: None.

CLINICAL DATA: Bright red blood per rectum

EXAM:
CT ABDOMEN AND PELVIS WITH CONTRAST
TECHNIQUE: Multidetector CT imaging of the abdomen and pelvis was performed
using the standard protocol following bolus administration of
intravenous contrast.
CONTRAST:  100mL OMNIPAQUE IOHEXOL 300 MG/ML  SOLN

[Series 2: routine abd/pel with · axial · 0.82mm/px · z∈[-546,-90]mm · 13 of 103 slices shown, 15 images]
[im 6/103  soft-tissue]
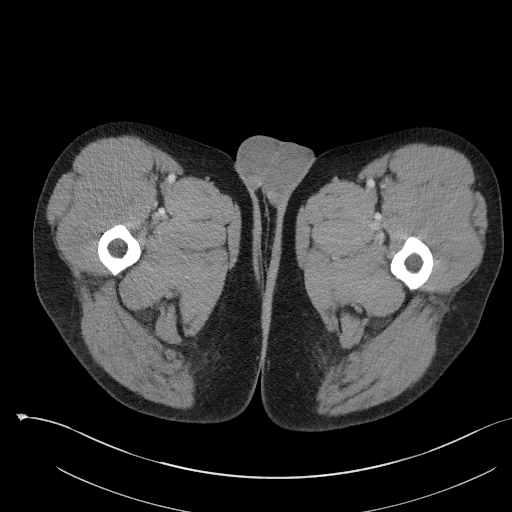
[im 6/103  bone]
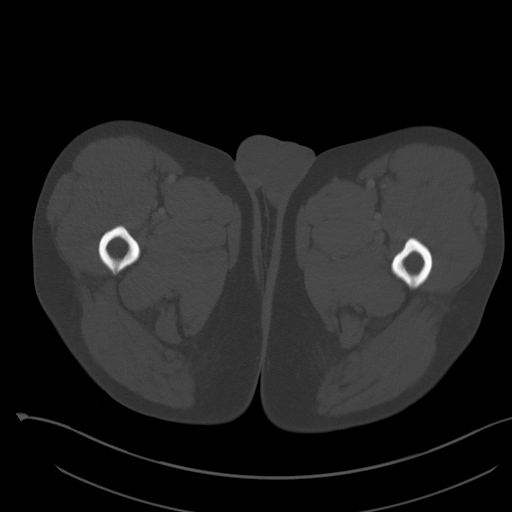
[im 17/103  soft-tissue]
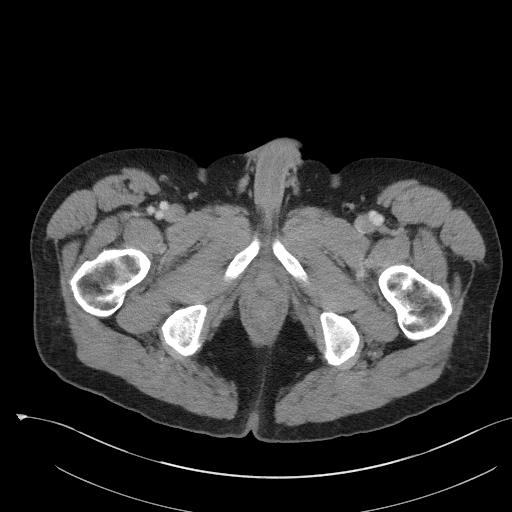
[im 22/103  soft-tissue]
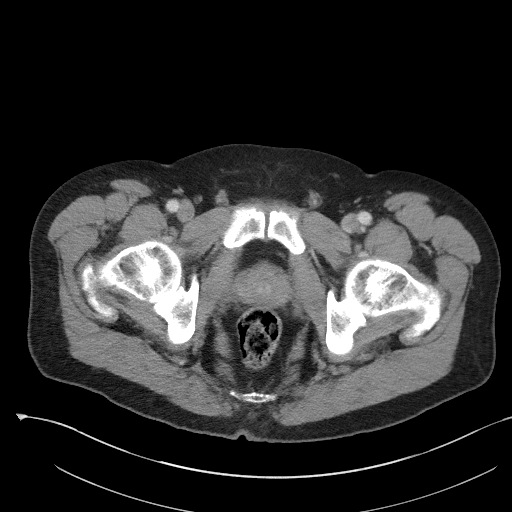
[im 27/103  soft-tissue]
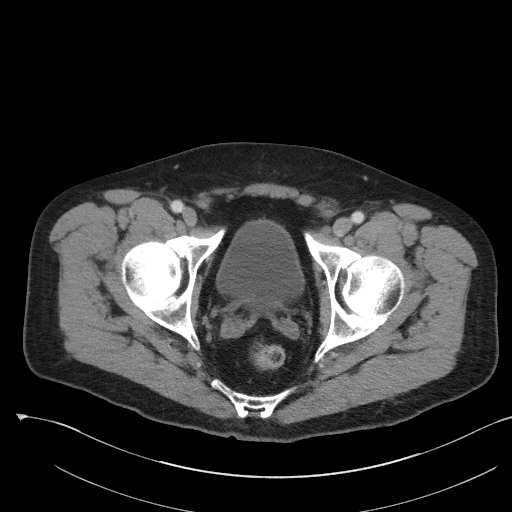
[im 38/103  soft-tissue]
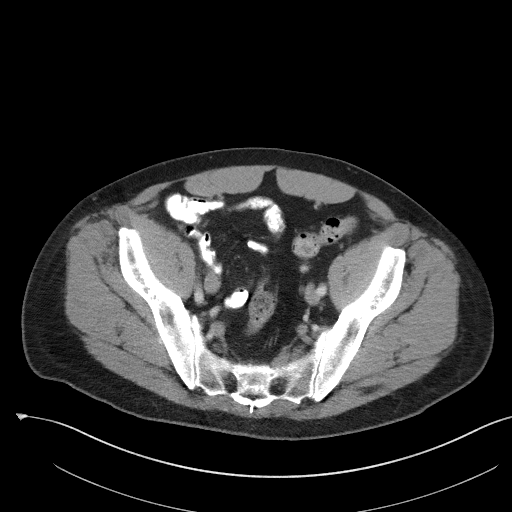
[im 43/103  soft-tissue]
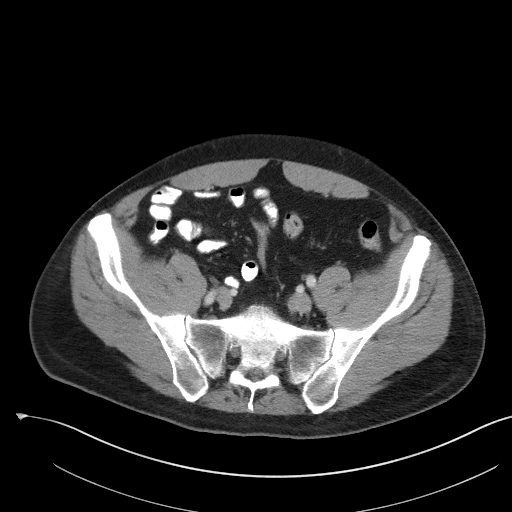
[im 54/103  soft-tissue]
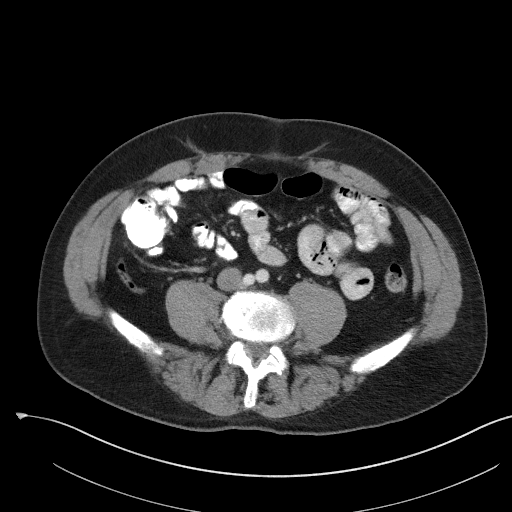
[im 60/103  soft-tissue]
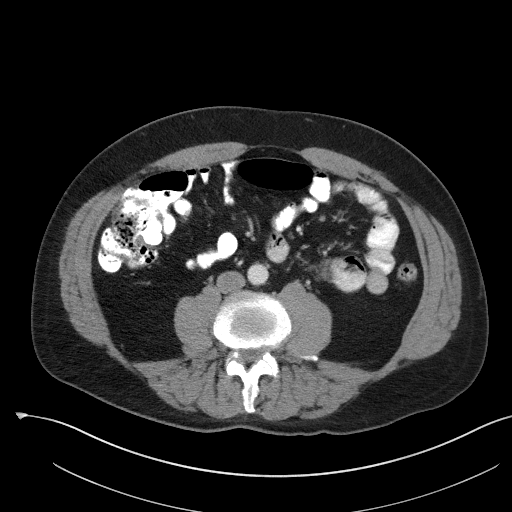
[im 65/103  soft-tissue]
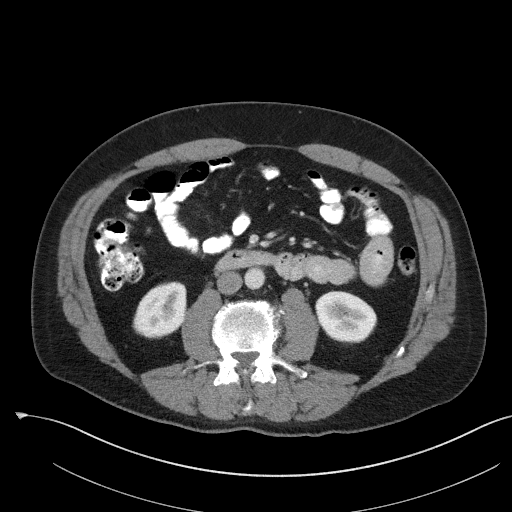
[im 65/103  bone]
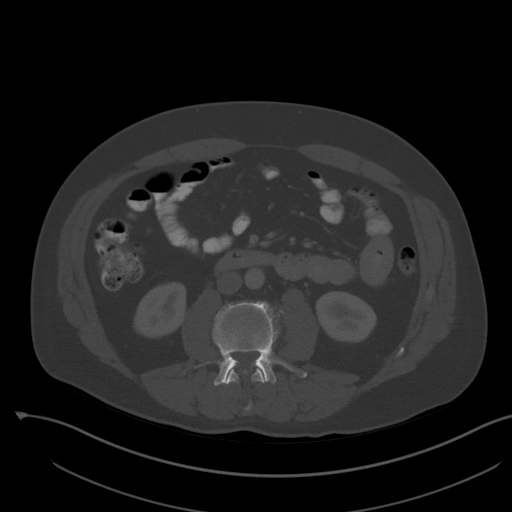
[im 76/103  soft-tissue]
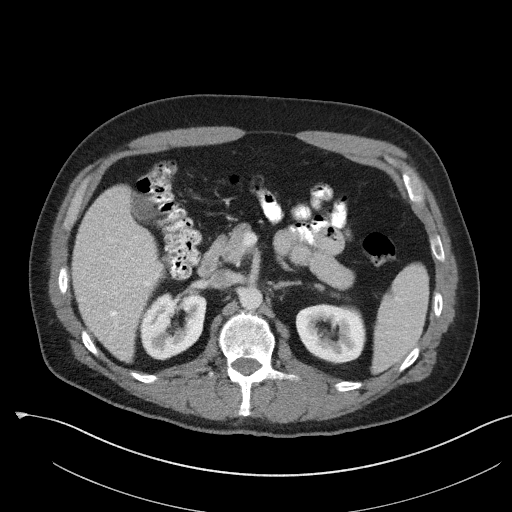
[im 81/103  soft-tissue]
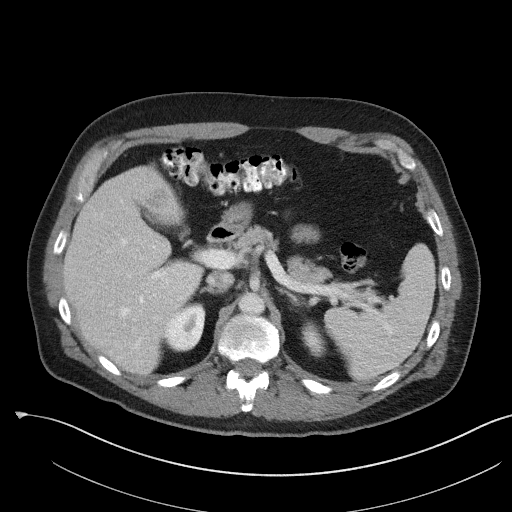
[im 86/103  soft-tissue]
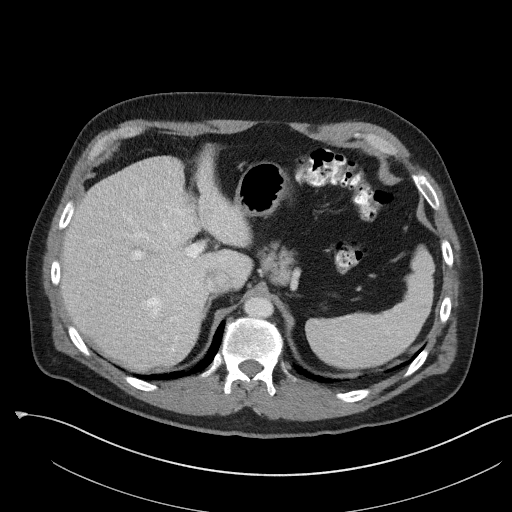
[im 97/103  soft-tissue]
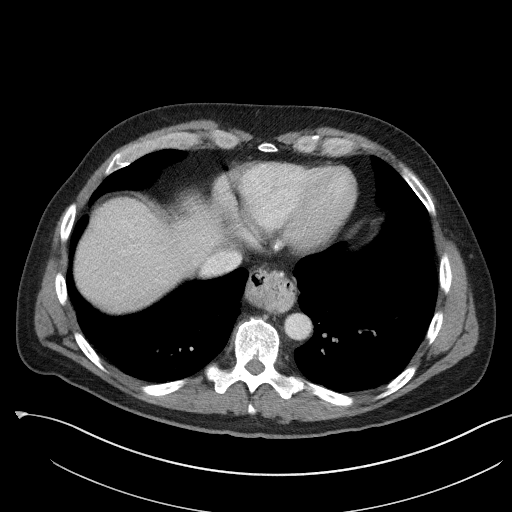

[Series 5: coronal st · coronal · 0.84mm/px · 3 of 97 slices shown]
[im 33/97  soft-tissue]
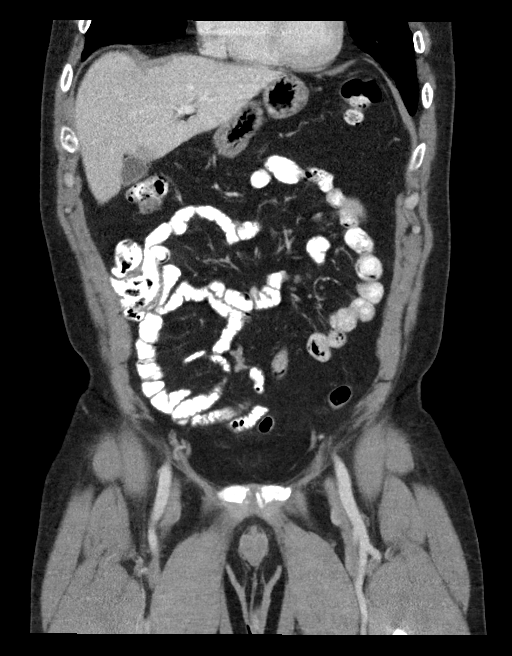
[im 43/97  soft-tissue]
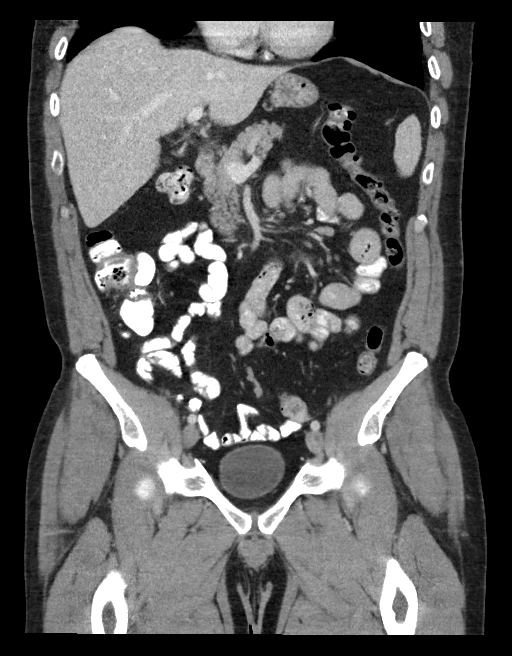
[im 54/97  soft-tissue]
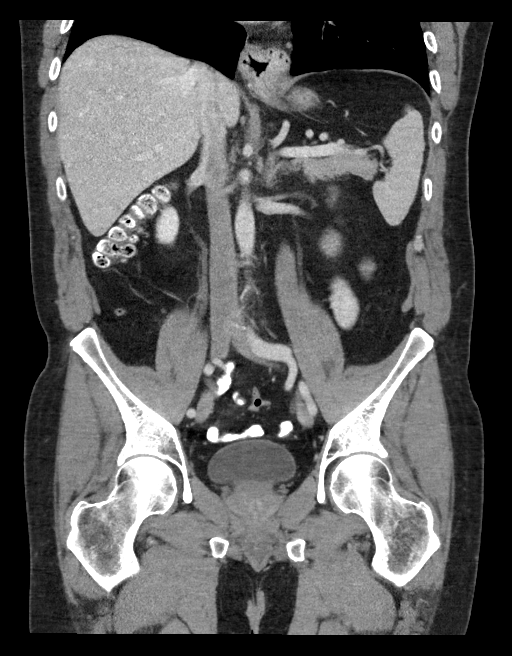

[16 of 46 positions shown; findings below may reference images not displayed]

FINDINGS: Lower chest: No acute pleural or parenchymal lung disease.

Hepatobiliary: No focal liver abnormality is seen. No gallstones,
gallbladder wall thickening, or biliary dilatation.

Pancreas: Unremarkable. No pancreatic ductal dilatation or
surrounding inflammatory changes.

Spleen: Normal in size without focal abnormality.

Adrenals/Urinary Tract: Adrenal glands are unremarkable. Kidneys are
normal, without renal calculi, focal lesion, or hydronephrosis.
Bladder is unremarkable.

Stomach/Bowel: No bowel obstruction or ileus. Normal appendix right
lower quadrant. No bowel wall thickening or inflammatory change.

Vascular/Lymphatic: No significant vascular findings are present. No
enlarged abdominal or pelvic lymph nodes.

Reproductive: Prostate is unremarkable.

Other: No free fluid or free gas.  No abdominal wall hernia.

Musculoskeletal: No acute or destructive bony lesions. Reconstructed
images demonstrate no additional findings.
IMPRESSION: 1. No acute intra-abdominal or intrapelvic process.

## 2021-05-28 ENCOUNTER — Encounter: Payer: Self-pay | Admitting: Physician Assistant

## 2021-05-28 ENCOUNTER — Ambulatory Visit: Payer: 59 | Admitting: Physician Assistant

## 2021-05-28 ENCOUNTER — Other Ambulatory Visit: Payer: Self-pay

## 2021-05-28 VITALS — BP 126/81 | HR 63 | Temp 97.6°F | Resp 14 | Ht 73.0 in | Wt 197.0 lb

## 2021-05-28 DIAGNOSIS — S0502XA Injury of conjunctiva and corneal abrasion without foreign body, left eye, initial encounter: Secondary | ICD-10-CM

## 2021-05-28 MED ORDER — NAPHAZOLINE-PHENIRAMINE 0.025-0.3 % OP SOLN: 2.0000 [drp] | Freq: Four times a day (QID) | OPHTHALMIC | 0 refills | Status: DC | PRN

## 2021-05-28 MED ORDER — TOBRAMYCIN 0.3 % OP SOLN: 2.0000 [drp] | Freq: Four times a day (QID) | OPHTHALMIC | 0 refills | Status: DC

## 2021-05-28 NOTE — Progress Notes (Signed)
Pt woke up this morning with something in left eye, its closed and can not open it. Burna Sis

## 2021-05-28 NOTE — Progress Notes (Signed)
   Subjective: Foreign body sensation left eye    Patient ID: Caleb Reyes, male    DOB: July 11, 1966, 55 y.o.   MRN: [id number]  HPI Patient presents with a.m. awakening foreign body sensation left eye.  Patient denies any provocative incident for complaint.  Patient does wear contact lenses and uses eyeglasses.  States pain with attempting to open the eye.  Denies any discharge.   Review of Systems Negative except for complaint    Objective:   Physical Exam  Mild distress.  Temperature 97.6, pulse 63, respiration 14, BP is 126/81, patient 97% O2 sat on room air.  Patient without 97 pounds and BMI is 25.99. Patient unable to do visual acuity secondary to pain and is not wearing corrective lenses or glasses.       Assessment & Plan: Corneal abrasion.   Tetracaine was applied and fluorescein stain revealed corneal abrasion but no foreign body.  Eye was copiously irrigated.  Patient given prescription for tobramycin and Naphcon.  Use eyedrops as directed if no improvement in 2 to 3 days return back to clinic.  Advised to wear eyeglasses instead of contact lenses for the next 3 to 5 days.

## 2021-06-15 ENCOUNTER — Other Ambulatory Visit: Payer: Self-pay

## 2021-06-15 ENCOUNTER — Other Ambulatory Visit: Payer: Self-pay | Admitting: Physician Assistant

## 2021-06-15 DIAGNOSIS — B349 Viral infection, unspecified: Secondary | ICD-10-CM

## 2021-06-15 DIAGNOSIS — J029 Acute pharyngitis, unspecified: Secondary | ICD-10-CM

## 2021-06-15 LAB — POCT INFLUENZA A/B
Influenza A, POC: NEGATIVE
Influenza B, POC: NEGATIVE

## 2021-06-15 LAB — POCT RAPID STREP A (OFFICE): Rapid Strep A Screen: NEGATIVE

## 2021-06-15 MED ORDER — METHYLPREDNISOLONE 4 MG PO TBPK: ORAL_TABLET | ORAL | 0 refills | Status: DC

## 2021-06-15 MED ORDER — LIDOCAINE VISCOUS HCL 2 % MT SOLN: 5.0000 mL | Freq: Four times a day (QID) | OROMUCOSAL | 0 refills | Status: DC | PRN

## 2021-06-15 MED ORDER — PSEUDOEPH-BROMPHEN-DM 30-2-10 MG/5ML PO SYRP: 5.0000 mL | ORAL_SOLUTION | Freq: Four times a day (QID) | ORAL | 0 refills | Status: DC | PRN

## 2021-06-15 NOTE — Progress Notes (Signed)
   Subjective: Sore throat and nasal congestion    Patient ID: Caleb Reyes, male    DOB: 01-Dec-1965, 55 y.o.   MRN: [id number]  HPI Patient presents with sore throat and nasal congestion for 3 days.  Patient states decreased voice volume in the past 2 days.  Patient tested negative for rapid strep and rapid flu today.  PCR for COVID is pending.   Review of Systems Negative septal complaint    Objective:   Physical Exam This is a virtual visit.       Assessment & Plan: Virallaryngitis /pharyngitis and nasal congestion   Patient given a prescription for Bromfed-DM, viscous lidocaine, and Medrol Dosepak.

## 2021-06-16 ENCOUNTER — Telehealth: Payer: Self-pay

## 2021-06-16 LAB — NOVEL CORONAVIRUS, NAA: SARS-CoV-2, NAA: NOT DETECTED

## 2021-06-16 LAB — SARS-COV-2, NAA 2 DAY TAT

## 2021-06-16 NOTE — Telephone Encounter (Signed)
Called Caleb Reyes to let him know PCR Covid test results are negative.  States he's feeling better than yesterday. Sounds better - voice isn't as raspy sounding.  Will continue meds as prescribed by Randel Pigg, PA-C.  Will follow-up with clinic prn.  AMD

## 2021-07-21 ENCOUNTER — Ambulatory Visit (INDEPENDENT_AMBULATORY_CARE_PROVIDER_SITE_OTHER): Payer: 59 | Admitting: Dermatology

## 2021-07-21 ENCOUNTER — Other Ambulatory Visit: Payer: Self-pay

## 2021-07-21 ENCOUNTER — Encounter: Payer: Self-pay | Admitting: Dermatology

## 2021-07-21 DIAGNOSIS — D1800 Hemangioma unspecified site: Secondary | ICD-10-CM | POA: Diagnosis not present

## 2021-07-21 DIAGNOSIS — L578 Other skin changes due to chronic exposure to nonionizing radiation: Secondary | ICD-10-CM

## 2021-07-21 DIAGNOSIS — K130 Diseases of lips: Secondary | ICD-10-CM | POA: Diagnosis not present

## 2021-07-21 DIAGNOSIS — L814 Other melanin hyperpigmentation: Secondary | ICD-10-CM | POA: Diagnosis not present

## 2021-07-21 DIAGNOSIS — D229 Melanocytic nevi, unspecified: Secondary | ICD-10-CM

## 2021-07-21 DIAGNOSIS — Z1283 Encounter for screening for malignant neoplasm of skin: Secondary | ICD-10-CM

## 2021-07-21 DIAGNOSIS — Z85828 Personal history of other malignant neoplasm of skin: Secondary | ICD-10-CM | POA: Diagnosis not present

## 2021-07-21 DIAGNOSIS — L821 Other seborrheic keratosis: Secondary | ICD-10-CM

## 2021-07-21 MED ORDER — HYDROCORTISONE 2.5 % EX CREA: TOPICAL_CREAM | CUTANEOUS | 0 refills | Status: DC

## 2021-07-21 NOTE — Progress Notes (Signed)
   Follow-Up Visit   Subjective  Jaxxen Voong is a 55 y.o. male who presents for the following: FBSE (Patient here for full body skin exam and skin cancer screening. Patient does have a hx of BCC. Patient's wife has noticed a spot above eyebrow but no other new or changing spots he is aware of. ).  The following portions of the chart were reviewed this encounter and updated as appropriate:   Tobacco  Allergies  Meds  Problems  Med Hx  Surg Hx  Fam Hx      Review of Systems:  No other skin or systemic complaints except as noted in HPI or Assessment and Plan.  Objective  Well appearing patient in no apparent distress; mood and affect are within normal limits.  A full examination was performed including scalp, head, eyes, ears, nose, lips, neck, chest, axillae, abdomen, back, buttocks, bilateral upper extremities, bilateral lower extremities, hands, feet, fingers, toes, fingernails, and toenails. All findings within normal limits unless otherwise noted below.  Lips Scale at lips   Assessment & Plan  Cheilitis Lips  Bothersome, recent onset (not chronic) Start HC 2.5% cream twice daily for 1 week.  If not improving consider adding ketoconazole cream  hydrocortisone 2.5 % cream - Lips Apply twice daily to affected area at lip for 1 week.  Lentigines - Scattered tan macules - Due to sun exposure - Benign-appearing, observe - Recommend daily broad spectrum sunscreen SPF 30+ to sun-exposed areas, reapply every 2 hours as needed. - Call for any changes  Seborrheic Keratoses - Stuck-on, waxy, tan-brown papules and/or plaques  - Benign-appearing - Discussed benign etiology and prognosis. - Observe - Call for any changes  Melanocytic Nevi - Tan-brown and/or pink-flesh-colored symmetric macules and papules - Benign appearing on exam today - Observation - Call clinic for new or changing moles - Recommend daily use of broad spectrum spf 30+ sunscreen to sun-exposed areas.    Hemangiomas - Red papules - Discussed benign nature - Observe - Call for any changes  Actinic Damage - Chronic condition, secondary to cumulative UV/sun exposure - diffuse scaly erythematous macules with underlying dyspigmentation - Recommend daily broad spectrum sunscreen SPF 30+ to sun-exposed areas, reapply every 2 hours as needed.  - Staying in the shade or wearing long sleeves, sun glasses (UVA+UVB protection) and wide brim hats (4-inch brim around the entire circumference of the hat) are also recommended for sun protection.  - Call for new or changing lesions.  Skin cancer screening performed today.  History of Basal Cell Carcinoma of the Skin - No evidence of recurrence today - Recommend regular full body skin exams - Recommend daily broad spectrum sunscreen SPF 30+ to sun-exposed areas, reapply every 2 hours as needed.  - Call if any new or changing lesions are noted between office visits  Return for TBSE 6-12 months.  Graciella Belton, RMA, am acting as scribe for Forest Gleason, MD .  Documentation: I have reviewed the above documentation for accuracy and completeness, and I agree with the above.  Forest Gleason, MD

## 2021-07-21 NOTE — Patient Instructions (Addendum)
Recommend Niacinamide or Nicotinamide 500mg  twice per day to lower risk of non-melanoma skin cancer by approximately 25%. This is usually available at [employer].   Recommend Vitamin D 600-800iu daily.   Melanoma ABCDEs  Melanoma is the most dangerous type of skin cancer, and is the leading cause of death from skin disease.  You are more likely to develop melanoma if you: Have light-colored skin, light-colored eyes, or red or blond hair Spend a lot of time in the sun Tan regularly, either outdoors or in a tanning bed Have had blistering sunburns, especially during childhood Have a close family member who has had a melanoma Have atypical moles or large birthmarks  Early detection of melanoma is key since treatment is typically straightforward and cure rates are extremely high if we catch it early.   The first sign of melanoma is often a change in a mole or a new dark spot.  The ABCDE system is a way of remembering the signs of melanoma.  A for asymmetry:  The two halves do not match. B for border:  The edges of the growth are irregular. C for color:  A mixture of colors are present instead of an even brown color. D for diameter:  Melanomas are usually (but not always) greater than 51mm - the size of a pencil eraser. E for evolution:  The spot keeps changing in size, shape, and color.  Please check your skin once per month between visits. You can use a small mirror in front and a large mirror behind you to keep an eye on the back side or your body.   If you see any new or changing lesions before your next follow-up, please call to schedule a visit.  Please continue daily skin protection including broad spectrum sunscreen SPF 30+ to sun-exposed areas, reapplying every 2 hours as needed when you're outdoors.    If you have any questions or concerns for your doctor, please call our main line at [phone] and press option 4 to reach your doctor's medical assistant. If no one answers,  please leave a voicemail as directed and we will return your call as soon as possible. Messages left after 4 pm will be answered the following business day.   You may also send [place] a message via [place]. We typically respond to MyChart messages within 1-2 business days.  For prescription refills, please ask your pharmacy to contact our office. Our fax number is [phone].  If you have an urgent issue when the clinic is closed that cannot wait until the next business day, you can page your doctor at the number below.    Please note that while we do our best to be available for urgent issues outside of office hours, we are not available 24/7.   If you have an urgent issue and are unable to reach [place], you may choose to seek medical care at your doctor's office, retail clinic, urgent care center, or emergency room.  If you have a medical emergency, please immediately call 911 or go to the emergency department.  Pager Numbers  - Dr. Nehemiah Massed: [phone]  - Dr. Laurence Ferrari: [phone]  - Dr. Nicole Kindred: [phone]  In the event of inclement weather, please call our main line at [phone] for an update on the status of any delays or closures.  Dermatology Medication Tips: Please keep the boxes that topical medications come in in order to help keep track of the instructions about where and how to use these. Pharmacies  typically print the medication instructions only on the boxes and not directly on the medication tubes.   If your medication is too expensive, please contact our office at [phone] option 4 or send [place] a message through [place].   We are unable to tell what your co-pay for medications will be in advance as this is different depending on your insurance coverage. However, we may be able to find a substitute medication at lower cost or fill out paperwork to get insurance to cover a needed medication.   If a prior authorization is required to get your medication covered by your  insurance company, please allow [place] 1-2 business days to complete this process.  Drug prices often vary depending on where the prescription is filled and some pharmacies may offer cheaper prices.  The website www.goodrx.com contains coupons for medications through different pharmacies. The prices here do not account for what the cost may be with help from insurance (it may be cheaper with your insurance), but the website can give you the price if you did not use any insurance.  - You can print the associated coupon and take it with your prescription to the pharmacy.  - You may also stop by our office during regular business hours and pick up a GoodRx coupon card.  - If you need your prescription sent electronically to a different pharmacy, notify our office through [facility] or by phone at [phone] option 4.

## 2021-07-23 ENCOUNTER — Encounter: Payer: Self-pay | Admitting: Dermatology

## 2021-08-31 ENCOUNTER — Other Ambulatory Visit: Payer: Self-pay

## 2021-08-31 ENCOUNTER — Encounter: Payer: Self-pay | Admitting: Physician Assistant

## 2021-08-31 ENCOUNTER — Ambulatory Visit: Payer: 59 | Admitting: Physician Assistant

## 2021-08-31 VITALS — BP 122/80 | HR 59 | Temp 97.3°F | Resp 12

## 2021-08-31 DIAGNOSIS — B009 Herpesviral infection, unspecified: Secondary | ICD-10-CM

## 2021-08-31 MED ORDER — ACYCLOVIR 5 % EX OINT: 1.0000 "application " | TOPICAL_OINTMENT | CUTANEOUS | 0 refills | Status: DC

## 2021-08-31 MED ORDER — VALACYCLOVIR HCL 1 G PO TABS: 1000.0000 mg | ORAL_TABLET | Freq: Two times a day (BID) | ORAL | 0 refills | Status: AC

## 2021-08-31 NOTE — Progress Notes (Signed)
Mouth sores 2-3 weeks Has been using chapstick Doesn't recall having had them before.  AMD

## 2021-08-31 NOTE — Progress Notes (Signed)
° °  Subjective:    Patient ID: Caleb Reyes, male    DOB: [birth year], 55 y.o.   MRN: [id number]  HPI Patient presents with sores on the upper and lower lip for approximately 2 weeks.  Onset of complaint was a burning sensation in the inner lip.  Patient said no relief using Chapstick and other over-the-counter preparations.  Review of Systems Negative except for chief complaint    Objective:   Physical Exam  No acute distress.  Temperature 97.3, pulse 59, respiration 12, BP is 122/80, and O2 sat is 99%. HEENT reveals vesicular lesions on the upper and lower      Assessment & Plan: Herpes simplex   Patient given prescription for Valtrex and topical acyclovir.  Patient vies follow-up with improvement 5 to 7 days.

## 2021-09-15 ENCOUNTER — Encounter: Payer: Self-pay | Admitting: Physician Assistant

## 2021-09-15 ENCOUNTER — Other Ambulatory Visit: Payer: Self-pay

## 2021-09-15 ENCOUNTER — Ambulatory Visit: Payer: Self-pay | Admitting: Physician Assistant

## 2021-09-15 VITALS — BP 106/62 | HR 76 | Temp 97.8°F | Resp 14 | Ht 73.0 in | Wt 198.0 lb

## 2021-09-15 DIAGNOSIS — B009 Herpesviral infection, unspecified: Secondary | ICD-10-CM

## 2021-09-15 MED ORDER — PENCICLOVIR 1 % EX CREA: 1.0000 "application " | TOPICAL_CREAM | CUTANEOUS | 1 refills | Status: AC

## 2021-09-15 MED ORDER — METHYLPREDNISOLONE 4 MG PO TBPK: ORAL_TABLET | ORAL | 0 refills | Status: DC

## 2021-09-15 NOTE — Progress Notes (Signed)
° °  Subjective: Cold sores    Patient ID: Caleb Reyes, male    DOB: 08-24-1966, 56 y.o.   MRN: [id number]  HPI Patient stated recurrent cold symptoms to inner lower lip.  Patient was seen on 08/31/2021 and given a prescription for acyclovir and Valtrex.  Patient stated lesion crusted over but reoccurred after cessation of medication.   Review of Systems  negative except for complaint    Objective:   Physical Exam No acute distress.  Temperature 97.8, pulse 76, respiration 14, BP is 106/62, patient 97% O2 sat on room air.   HEENT remarkable for overall lesion mucosa area on the lower inner lip.       Assessment & Plan:   Patient given a trial of Denavir Medrol Dosepak.  Advised to follow-up telephonically in 2 days.  If no improvement will consult to dermatology.

## 2021-09-15 NOTE — Progress Notes (Signed)
Pt was treated for a cold sore 08/31/21 in our clinic. Pt stating as of today he is still feeling lip pain but the cold sore has went away.

## 2021-11-04 ENCOUNTER — Ambulatory Visit (INDEPENDENT_AMBULATORY_CARE_PROVIDER_SITE_OTHER): Payer: 59 | Admitting: Dermatology

## 2021-11-04 ENCOUNTER — Other Ambulatory Visit: Payer: Self-pay

## 2021-11-04 DIAGNOSIS — L439 Lichen planus, unspecified: Secondary | ICD-10-CM | POA: Diagnosis not present

## 2021-11-04 MED ORDER — TRIAMCINOLONE ACETONIDE 0.1 % MT PSTE: PASTE | OROMUCOSAL | 1 refills | Status: DC

## 2021-11-04 NOTE — Progress Notes (Signed)
° °  Follow-Up Visit   Subjective  Caleb Reyes is a 56 y.o. male who presents for the following: Skin Problem (Patient here today for cold sores at upper and lower lips. Patient advises has been present since December and won't go away. He was given prednisone, hydrocortisone and Denavir. Patient does not have a hx of fever blisters and has not been given valacyclovir. ).  No rashes anywhere else per patient.  The following portions of the chart were reviewed this encounter and updated as appropriate:       Review of Systems:  No other skin or systemic complaints except as noted in HPI or Assessment and Plan.  Objective  Well appearing patient in no apparent distress; mood and affect are within normal limits.  A focused examination was performed including face, lips, mouth. Relevant physical exam findings are noted in the Assessment and Plan.  Lips White net like patches anterior lip mucosa    Assessment & Plan  Lichen planus Lips  Favor lichen planus with wickham's striae  Start TMC 0.1% dental paste 1-2 times daily pulling lip down, drying area and then applying paste. Recommend applying at bedtime.   Recheck at f/u.     triamcinolone (KENALOG) 0.1 % paste - Lips 1-2 times daily pulling lip down, drying area and then applying paste. Recommend applying at bedtime.  Related Procedures Hep B Surface Antibody Hep B Surface Antigen Hepatitis B Core AB, Total Hep C Antibody   Return in about 4 weeks (around 12/02/2021).  Graciella Belton, RMA, am acting as scribe for Forest Gleason, MD .  Documentation: I have reviewed the above documentation for accuracy and completeness, and I agree with the above.  Forest Gleason, MD

## 2021-11-04 NOTE — Patient Instructions (Addendum)
Start triamcinolone 0.1% dental paste 1-2 times daily pulling lip down, drying area and then applying paste. Recommend applying at bedtime.   Recommend switching to toothpaste without sodium laurel sulfate - Sensodyne  If You Need Anything After Your Visit  If you have any questions or concerns for your doctor, please call our main line at [phone] and press option 4 to reach your doctor's medical assistant. If no one answers, please leave a voicemail as directed and we will return your call as soon as possible. Messages left after 4 pm will be answered the following business day.   You may also send [place] a message via [place]. We typically respond to MyChart messages within 1-2 business days.  For prescription refills, please ask your pharmacy to contact our office. Our fax number is [phone].  If you have an urgent issue when the clinic is closed that cannot wait until the next business day, you can page your doctor at the number below.    Please note that while we do our best to be available for urgent issues outside of office hours, we are not available 24/7.   If you have an urgent issue and are unable to reach [place], you may choose to seek medical care at your doctor's office, retail clinic, urgent care center, or emergency room.  If you have a medical emergency, please immediately call 911 or go to the emergency department.  Pager Numbers  - Dr. Nehemiah Massed: [phone]  - Dr. Laurence Ferrari: [phone]  - Dr. Nicole Kindred: [phone]  In the event of inclement weather, please call our main line at [phone] for an update on the status of any delays or closures.  Dermatology Medication Tips: Please keep the boxes that topical medications come in in order to help keep track of the instructions about where and how to use these. Pharmacies typically print the medication instructions only on the boxes and not directly on the medication tubes.   If your medication is too expensive, please  contact our office at [phone] option 4 or send [place] a message through [place].   We are unable to tell what your co-pay for medications will be in advance as this is different depending on your insurance coverage. However, we may be able to find a substitute medication at lower cost or fill out paperwork to get insurance to cover a needed medication.   If a prior authorization is required to get your medication covered by your insurance company, please allow [place] 1-2 business days to complete this process.  Drug prices often vary depending on where the prescription is filled and some pharmacies may offer cheaper prices.  The website www.goodrx.com contains coupons for medications through different pharmacies. The prices here do not account for what the cost may be with help from insurance (it may be cheaper with your insurance), but the website can give you the price if you did not use any insurance.  - You can print the associated coupon and take it with your prescription to the pharmacy.  - You may also stop by our office during regular business hours and pick up a GoodRx coupon card.  - If you need your prescription sent electronically to a different pharmacy, notify our office through [facility] or by phone at [phone] option 4.     Si Usted Necesita Algo Despus de Su Visita  Tambin puede enviarnos un mensaje a travs de Pharmacist, community. Por lo general respondemos a los mensajes de Therapist, occupational transcurso  de 1 a 2 das hbiles.  Para renovar recetas, por favor pida a su farmacia que se ponga en contacto con nuestra oficina. Harland Dingwall de fax es [place] [phone].  Si tiene un asunto urgente cuando la clnica est cerrada y que no puede esperar hasta el siguiente da hbil, puede llamar/localizar a su doctor(a) al nmero que aparece a continuacin.   Por favor, tenga en cuenta que aunque hacemos todo lo posible para estar disponibles para asuntos urgentes fuera del horario de  [place], no estamos disponibles las 24 horas del da, los 7 das de la [place].   Si tiene un problema urgente y no puede comunicarse con nosotros, puede optar por buscar atencin mdica  en el consultorio de su doctor(a), en una clnica privada, en un centro de atencin urgente o en una sala de emergencias.  Si tiene Engineering geologist, por favor llame inmediatamente al 911 o vaya a la sala de emergencias.  Nmeros de bper  - Dr. Nehemiah Massed: [phone]  - Dra. Moye: [phone]  - Dra. Nicole Kindred: [phone]  En caso de inclemencias del [place], por favor llame a Johnsie Kindred principal al [phone] para una actualizacin sobre el [place] de cualquier retraso o cierre.  Consejos para la medicacin en dermatologa: Por favor, guarde las cajas en las que vienen los medicamentos de uso tpico para ayudarle a seguir las instrucciones sobre dnde y cmo usarlos. Las farmacias generalmente imprimen las instrucciones del medicamento slo en las cajas y no directamente en los tubos del [place].   Si su medicamento es muy caro, por favor, pngase en contacto con Zigmund Daniel llamando al [phone] y presione la opcin 4 o envenos un mensaje a travs de Pharmacist, community.   No podemos decirle cul ser su copago por los medicamentos por adelantado ya que esto es diferente dependiendo de la cobertura de su seguro. Sin embargo, es posible que podamos encontrar un medicamento sustituto a Electrical engineer un formulario para que el seguro cubra el medicamento que se considera necesario.   Si se requiere una autorizacin previa para que su compaa de seguros [place] su medicamento, por favor permtanos de 1 a 2 das hbiles para completar este proceso.  Los precios de los medicamentos varan con frecuencia dependiendo del Environmental consultant de dnde se surte la receta y alguna farmacias pueden ofrecer precios ms baratos.  El sitio web www.goodrx.com tiene cupones para medicamentos de Airline pilot. Los  precios aqu no tienen en cuenta lo que podra costar con la ayuda del seguro (puede ser ms barato con su seguro), pero el sitio web puede darle el precio si no utiliz Research scientist (physical sciences).  - Puede imprimir el cupn correspondiente y llevarlo con su receta a la farmacia.  - Tambin puede pasar por nuestra oficina durante el horario de atencin regular y Charity fundraiser una tarjeta de cupones de GoodRx.  - Si necesita que su receta se enve electrnicamente a una farmacia diferente, informe a nuestra oficina a travs de MyChart de  o por telfono llamando al [phone] y presione la opcin 4.

## 2021-11-05 ENCOUNTER — Telehealth: Payer: Self-pay

## 2021-11-05 LAB — HEPATITIS B SURFACE ANTIGEN: Hepatitis B Surface Ag: NEGATIVE

## 2021-11-05 LAB — HEPATITIS B CORE ANTIBODY, TOTAL: Hep B Core Total Ab: NEGATIVE

## 2021-11-05 LAB — HEPATITIS C ANTIBODY: Hep C Virus Ab: NONREACTIVE

## 2021-11-05 LAB — HEPATITIS B SURFACE ANTIBODY,QUALITATIVE: Hep B Surface Ab, Qual: REACTIVE

## 2021-11-05 NOTE — Telephone Encounter (Signed)
-----   Message from Alfonso Patten, MD sent at 11/05/2021  8:08 AM EST ----- Negative for hepatitis B and C. Hepatitis B immune.  No other testing recommended at this time.  MAs please call. Thank you!

## 2021-11-05 NOTE — Telephone Encounter (Addendum)
°  Tried calling patient regarding results. No answer. Left message on machine to call office.    ----- Message from Alfonso Patten, MD sent at 11/05/2021  8:08 AM EST ----- Negative for hepatitis B and C. Hepatitis B immune.  No other testing recommended at this time.  MAs please call. Thank you!

## 2021-11-05 NOTE — Telephone Encounter (Signed)
Patient advised labs negative for Hep B and C. Lurlean Horns., RMA

## 2021-12-02 ENCOUNTER — Ambulatory Visit (INDEPENDENT_AMBULATORY_CARE_PROVIDER_SITE_OTHER): Payer: 59 | Admitting: Dermatology

## 2021-12-02 ENCOUNTER — Other Ambulatory Visit: Payer: Self-pay

## 2021-12-02 DIAGNOSIS — L439 Lichen planus, unspecified: Secondary | ICD-10-CM

## 2021-12-02 MED ORDER — TRETINOIN MICROSPHERE 0.1 % EX GEL: CUTANEOUS | 3 refills | Status: DC

## 2021-12-02 NOTE — Progress Notes (Signed)
? ?  Follow-Up Visit ?  ?Subjective  ?Caleb Reyes is a 56 y.o. male who presents for the following: Lichen planus (Lip has improved but is still chronic, persistent, and annoying. Currently he is using TMC paste QD, because if he skips a few days area becomes very irritated.). ? ?The following portions of the chart were reviewed this encounter and updated as appropriate:  ? Tobacco  Allergies  Meds  Problems  Med Hx  Surg Hx  Fam Hx   ?  ?Review of Systems:  No other skin or systemic complaints except as noted in HPI or Assessment and Plan. ? ?Objective  ?Well appearing patient in no apparent distress; mood and affect are within normal limits. ? ?A focused examination was performed including the lips and oral mucosa. Relevant physical exam findings are noted in the Assessment and Plan. ? ?Lips ?R cheek buccal mucosa clear, L cheek buccal mucosa clear, inside the upper lip buccal mucosa white patches. ? ? ?Assessment & Plan  ?Lichen planus ?Lips ?With Wickham's striae - Discussed no known cause only treatment/management, not an infectious condition but a benign inflammatory condition.  ? ?Chronic and persistent condition with duration or expected duration over one year. Condition is symptomatic / bothersome to patient. Not to goal. ? ?Start Tretinoin 0.1% gel to aa's every other night alternating with TMC paste every other night.  ? ?tretinoin microspheres (RETIN-A MICRO) 0.1 % gel - Lips ?Apply to aa's inside the mouth and lip every other night alternating with Triamcinolone paste. ? ?Related Medications ?triamcinolone (KENALOG) 0.1 % paste ?1-2 times daily pulling lip down, drying area and then applying paste. Recommend applying at bedtime. ? ?Return in about 2 months (around 2/35/5732) for lichen planus follow up . ? ?IRudell Cobb, CMA, am acting as scribe for Sarina Ser, MD . ?Documentation: I have reviewed the above documentation for accuracy and completeness, and I agree with the  above. ? ?Sarina Ser, MD ? ?

## 2021-12-02 NOTE — Patient Instructions (Signed)

## 2021-12-03 ENCOUNTER — Ambulatory Visit: Payer: Self-pay

## 2021-12-03 DIAGNOSIS — Z0000 Encounter for general adult medical examination without abnormal findings: Secondary | ICD-10-CM

## 2021-12-03 LAB — POCT URINALYSIS DIPSTICK
Bilirubin, UA: NEGATIVE
Blood, UA: NEGATIVE
Glucose, UA: NEGATIVE
Ketones, UA: NEGATIVE
Leukocytes, UA: NEGATIVE
Nitrite, UA: NEGATIVE
Protein, UA: NEGATIVE
Spec Grav, UA: >=1.03 — AB (ref 1.010–1.025)
Urobilinogen, UA: 0.2 E.U./dL
pH, UA: 5.5 (ref 5.0–8.0)

## 2021-12-03 NOTE — Progress Notes (Signed)
Pt presents today for physical labs, will return to clinic for scheduled physical with Ron Smith,PA-C ?

## 2021-12-04 LAB — CMP12+LP+TP+TSH+6AC+PSA+CBC…
ALT: 14 IU/L (ref 0–44)
AST: 20 IU/L (ref 0–40)
Albumin/Globulin Ratio: 2.7 — ABNORMAL HIGH (ref 1.2–2.2)
Albumin: 4.6 g/dL (ref 3.8–4.9)
Alkaline Phosphatase: 69 IU/L (ref 44–121)
BUN/Creatinine Ratio: 17 (ref 9–20)
BUN: 20 mg/dL (ref 6–24)
Basophils Absolute: 0 10*3/uL (ref 0.0–0.2)
Basos: 1 %
Bilirubin Total: 0.5 mg/dL (ref 0.0–1.2)
Calcium: 9.4 mg/dL (ref 8.7–10.2)
Chloride: 100 mmol/L (ref 96–106)
Chol/HDL Ratio: 4 ratio (ref 0.0–5.0)
Cholesterol, Total: 234 mg/dL — ABNORMAL HIGH (ref 100–199)
Creatinine, Ser: 1.21 mg/dL (ref 0.76–1.27)
EOS (ABSOLUTE): 0.2 10*3/uL (ref 0.0–0.4)
Eos: 2 %
Estimated CHD Risk: 0.7 times avg. (ref 0.0–1.0)
Free Thyroxine Index: 1.5 (ref 1.2–4.9)
GGT: 18 IU/L (ref 0–65)
Globulin, Total: 1.7 g/dL (ref 1.5–4.5)
Glucose: 97 mg/dL (ref 70–99)
HDL: 59 mg/dL (ref >39)
Hematocrit: 44 % (ref 37.5–51.0)
Hemoglobin: 14.5 g/dL (ref 13.0–17.7)
Immature Grans (Abs): 0 10*3/uL (ref 0.0–0.1)
Immature Granulocytes: 0 %
Iron: 69 ug/dL (ref 38–169)
LDH: 196 IU/L (ref 121–224)
LDL Chol Calc (NIH): 143 mg/dL — ABNORMAL HIGH (ref 0–99)
Lymphocytes Absolute: 1.2 10*3/uL (ref 0.7–3.1)
Lymphs: 16 %
MCH: 27.3 pg (ref 26.6–33.0)
MCHC: 33 g/dL (ref 31.5–35.7)
MCV: 83 fL (ref 79–97)
Monocytes Absolute: 0.6 10*3/uL (ref 0.1–0.9)
Monocytes: 7 %
Neutrophils Absolute: 5.8 10*3/uL (ref 1.4–7.0)
Neutrophils: 74 %
Phosphorus: 3.7 mg/dL (ref 2.8–4.1)
Platelets: 236 10*3/uL (ref 150–450)
Potassium: 5 mmol/L (ref 3.5–5.2)
Prostate Specific Ag, Serum: 0.5 ng/mL (ref 0.0–4.0)
RBC: 5.32 x10E6/uL (ref 4.14–5.80)
RDW: 12.3 % (ref 11.6–15.4)
Sodium: 144 mmol/L (ref 134–144)
T3 Uptake Ratio: 30 % (ref 24–39)
T4, Total: 5 ug/dL (ref 4.5–12.0)
TSH: 3.76 u[IU]/mL (ref 0.450–4.500)
Total Protein: 6.3 g/dL (ref 6.0–8.5)
Triglycerides: 181 mg/dL — ABNORMAL HIGH (ref 0–149)
Uric Acid: 5.6 mg/dL (ref 3.8–8.4)
VLDL Cholesterol Cal: 32 mg/dL (ref 5–40)
WBC: 7.7 10*3/uL (ref 3.4–10.8)
eGFR: 70 mL/min/{1.73_m2} (ref >59)

## 2021-12-06 ENCOUNTER — Encounter: Payer: Self-pay | Admitting: Dermatology

## 2021-12-08 ENCOUNTER — Ambulatory Visit: Payer: Self-pay | Admitting: Physician Assistant

## 2021-12-08 ENCOUNTER — Other Ambulatory Visit: Payer: Self-pay

## 2021-12-08 ENCOUNTER — Encounter: Payer: Self-pay | Admitting: Physician Assistant

## 2021-12-08 VITALS — BP 123/76 | HR 71 | Temp 97.7°F | Resp 12 | Ht 73.0 in | Wt 198.0 lb

## 2021-12-08 DIAGNOSIS — Z0000 Encounter for general adult medical examination without abnormal findings: Secondary | ICD-10-CM

## 2021-12-08 NOTE — Progress Notes (Signed)
? ?[facility] ? ?____________________________________________ ? ? None  ?  (approximate) ? ?I have reviewed the triage vital signs and the nursing notes. ? ? ?HISTORY ? ?Chief Complaint ?Annual Exam ? ? ?HPI ?Caleb Reyes is a 56 y.o. male Patient presents for annual physical exam.  Patient reports no concerns or complaints.  Patient mention that he did not fast for his labs.  Past medical history remarkable for GERD ?   ? ?  ? ? ?Past Medical History:  ?Diagnosis Date  ? Basal cell carcinoma 11/30/2019  ? left lower back  ? Basal cell carcinoma 10/06/2018  ? left neck  ? GERD (gastroesophageal reflux disease)   ? no issues in over 5 yrs  ? Wears contact lenses   ? ? ?Patient Active Problem List  ? Diagnosis Date Noted  ? History of colonic polyps   ? Polyp of sigmoid colon   ? Special screening for malignant neoplasms, colon   ? Benign neoplasm of descending colon   ? Benign neoplasm of cecum   ? Benign neoplasm of sigmoid colon   ? ? ?Past Surgical History:  ?Procedure Laterality Date  ? COLONOSCOPY    ? COLONOSCOPY WITH PROPOFOL N/A 10/30/2015  ? Procedure: COLONOSCOPY WITH PROPOFOL;  Surgeon: Lucilla Lame, MD;  Location: [facility];  Service: Endoscopy;  Laterality: N/A;  PER PT AND GINGER PT WANTS TO ARRIVE AFTER 10  ? COLONOSCOPY WITH PROPOFOL N/A 09/09/2020  ? Procedure: COLONOSCOPY WITH PROPOFOL;  Surgeon: Lucilla Lame, MD;  Location: [facility] ENDOSCOPY;  Service: Endoscopy;  Laterality: N/A;  ? POLYPECTOMY  10/30/2015  ? Procedure: POLYPECTOMY;  Surgeon: Lucilla Lame, MD;  Location: [place];  Service: Endoscopy;;  ? ? ?Prior to Admission medications   ?Medication Sig Start Date End Date Taking? Authorizing Provider  ?FIBER PO Take by mouth daily.   Yes [provider]  ?MELATONIN PO Take by mouth.   Yes [provider]  ?Multiple Vitamin (MULTIVITAMIN) tablet Take 1 tablet by mouth daily.   Yes [provider]  ?penciclovir  (DENAVIR) 1 % cream Apply 1 application topically every 2 (two) hours. 09/15/21  Yes Sable Feil, PA-C  ?tretinoin microspheres (RETIN-A MICRO) 0.1 % gel Apply to aa's inside the mouth and lip every other night alternating with Triamcinolone paste. 12/02/21  Yes Ralene Bathe, MD  ?triamcinolone (KENALOG) 0.1 % paste 1-2 times daily pulling lip down, drying area and then applying paste. Recommend applying at bedtime. 11/04/21  Yes Moye, [place], MD  ? ? ?Allergies ?Sulfa antibiotics ? ?History reviewed. No pertinent family history. ? ?Social History ?Social History  ? ?Tobacco Use  ? Smoking status: Some Days  ?  Types: Cigars  ? Smokeless tobacco: Never  ? Tobacco comments:  ?  1-2 cigars/wk  ?Vaping Use  ? Vaping Use: Never used  ?Substance Use Topics  ? Alcohol use: Yes  ?  Alcohol/week: 7.0 standard drinks  ?  Types: 7 Cans of beer per week  ? Drug use: Never  ? ? ?Review of Systems ?Constitutional: No fever/chills ?Eyes: No visual changes. ?ENT: No sore throat. ?Cardiovascular: Denies chest pain. ?Respiratory: Denies shortness of breath. ?Gastrointestinal: No abdominal pain.  No nausea, no vomiting.  No diarrhea.  No constipation.  GERD ?Genitourinary: Negative for dysuria. ?Musculoskeletal: Negative for back pain. ?Skin: Negative for rash. ?Neurological: Negative for headaches, focal weakness or numbness. ?Allergic/Immunilogical: Sulfa antibiotics ? ?____________________________________________ ? ? ?PHYSICAL EXAM: ? ?VITAL SIGNS: Temperature  97.7, pulse 71, respiration 12, BP is 123/76, and patient is 97% O2 sat on room air.  Patient weighs 198 pounds and BMI is 26.12. ?Constitutional: Alert and oriented. Well appearing and in no acute distress. ?Eyes: Conjunctivae are normal. PERRL. EOMI. ?Head: Atraumatic. ?Nose: No congestion/rhinnorhea. ?Mouth/Throat: Mucous membranes are moist.  Oropharynx non-erythematous. ?Neck: No stridor.  No cervical spine tenderness to  palpation. ?Hematological/Lymphatic/Immunilogical: No cervical lymphadenopathy. ?Cardiovascular: Normal rate, regular rhythm. Grossly normal heart sounds.  Good peripheral circulation. ?Respiratory: Normal respiratory effort.  No retractions. Lungs CTAB. ?Gastrointestinal: Soft and nontender. No distention. No abdominal bruits. No CVA tenderness. ?Genitourinary: Deferred ?Musculoskeletal: No lower extremity tenderness nor edema.  No joint effusions. ?Neurologic:  Normal speech and language. No gross focal neurologic deficits are appreciated. No gait instability. ?Skin:  Skin is warm, dry and intact. No rash noted. ?Psychiatric: Mood and affect are normal. Speech and behavior are normal. ? ?____________________________________________ ?  ?LABS ?    ?Component Ref Range & Units 5 d ago  ?Color, UA  yellow   ?Clarity, UA  clear   ?Glucose, UA Negative Negative   ?Bilirubin, UA  negative   ?Ketones, UA  negative   ?Spec Grav, UA 1.010 - 1.025 >=1.030 Abnormal    ?Blood, UA  negative   ?pH, UA 5.0 - 8.0 5.5   ?Protein, UA Negative Negative   ?Urobilinogen, UA 0.2 or 1.0 E.U./dL 0.2   ?Nitrite, UA  negative   ?Leukocytes, UA Negative Negative   ?Appearance    ?Odor    ?  ? ?  ?  ?Specimen Collected: 12/03/21 09:32 Last Resulted: 12/03/21 09:32  ?  ?  Lab Flowsheet   ? Order Details   ? View Encounter   ? Lab and Collection Details   ? Routing   ? Result History    ?View Encounter Conversation    ?  ?  ?Result Care Coordination ? ? ?Patient Communication ? ? Add Comments   Seen Back to Top  ?  ?  ? ?Other Results from 12/03/2021 ? ? Contains abnormal data CMP12+LP+TP+TSH+6AC+PSA+CBC? ?Order: [id number] ?Status: Final result    ?Visible to patient: Yes (seen)    ?Next appt: 02/01/2022 at 02:00 PM in Dermatology Sarina Ser, MD)    ?Dx: Routine adult health maintenance    ?0 Result Notes ?       ?Component Ref Range & Units 5 d ago ?(12/03/21) 1 yr ago ?(08/15/20) 1 yr ago ?(08/15/20) 6 yr ago ?(10/10/15)  ?Glucose 70 - 99  mg/dL 97  92 CM   92 R   ?Uric Acid 3.8 - 8.4 mg/dL 5.6    6.0 R, CM   ?Comment:            Therapeutic target for gout patients: <6.0  ?BUN 6 - 24 mg/dL 20  19 R   20   ?Creatinine, Ser 0.76 - 1.27 mg/dL 1.21  1.17 R   1.15   ?eGFR >59 mL/min/1.73 70      ?BUN/Creatinine Ratio 9 - 20 17    17    ?Sodium 134 - 144 mmol/L 144  138 R   142   ?Potassium 3.5 - 5.2 mmol/L 5.0  4.4 R   4.9   ?Chloride 96 - 106 mmol/L 100  99 R   101   ?Calcium 8.7 - 10.2 mg/dL 9.4  9.1 R   9.2   ?Phosphorus 2.8 - 4.1 mg/dL 3.7    3.3 R   ?Total Protein 6.0 -  8.5 g/dL 6.3  6.8 R   6.2   ?Albumin 3.8 - 4.9 g/dL 4.6  4.2 R   4.5 R   ?Globulin, Total 1.5 - 4.5 g/dL 1.7    1.7   ?Albumin/Globulin Ratio 1.2 - 2.2 2.7 High     2.6 High  R   ?Bilirubin Total 0.0 - 1.2 mg/dL 0.5  0.7 R   0.3   ?Alkaline Phosphatase 44 - 121 IU/L 69  68 R   63 R   ?LDH 121 - 224 IU/L 196    182   ?AST 0 - 40 IU/L 20  19 R   20   ?ALT 0 - 44 IU/L 14  16 R   14   ?GGT 0 - 65 IU/L 18    14   ?Iron 38 - 169 ug/dL 69    68   ?Cholesterol, Total 100 - 199 mg/dL 234 High     203 High    ?Triglycerides 0 - 149 mg/dL 181 High     95   ?HDL >39 mg/dL 59    56   ?VLDL Cholesterol Cal 5 - 40 mg/dL 32    19   ?LDL Chol Calc (NIH) 0 - 99 mg/dL 143 High       ?Chol/HDL Ratio 0.0 - 5.0 ratio 4.0    3.6 R, CM   ?Comment:                                   T. Chol/HDL Ratio  ?                                            Men  Women  ?                              1/2 Avg.Risk  3.4    3.3  ?                                  Avg.Risk  5.0    4.4  ?                               2X Avg.Risk  9.6    7.1  ?                               3X Avg.Risk 23.4   11.0   ?Estimated CHD Risk 0.0 - 1.0 times avg. 0.7    0.6 R, CM   ?Comment: The CHD Risk is based on the T. Chol/HDL ratio. Other  ?factors affect CHD Risk such as hypertension, smoking,  ?diabetes, severe obesity, and family history of  ?premature CHD.   ?TSH 0.450 - 4.500 uIU/mL 3.760    3.700   ?T4, Total 4.5 - 12.0 ug/dL 5.0    5.7    ?T3 Uptake Ratio 24 - 39 % 30    32   ?Free Thyroxine Index 1.2 - 4.9 1.5    1.8   ?Prostate Specific Ag, Serum 0.0 - 4.0 ng/mL 0.5  0.5 CM   ?Comment: [employer] ECLIA methodology.  ?According to the American [employer] Association, Serum PSA should  ?d

## 2021-12-08 NOTE — Progress Notes (Signed)
Pt denies concerns or issues at this time/CL,RMA ?

## 2021-12-30 ENCOUNTER — Other Ambulatory Visit: Payer: Self-pay

## 2021-12-30 DIAGNOSIS — L439 Lichen planus, unspecified: Secondary | ICD-10-CM

## 2021-12-30 MED ORDER — TRIAMCINOLONE ACETONIDE 0.1 % MT PSTE: PASTE | OROMUCOSAL | 0 refills | Status: DC

## 2021-12-30 NOTE — Progress Notes (Signed)
Patient called requesting RF of TMC Paste. RX sent in and left voicemail for patient advising him this is done. aw ?

## 2022-02-01 ENCOUNTER — Ambulatory Visit: Payer: 59 | Admitting: Dermatology

## 2022-02-01 DIAGNOSIS — L810 Postinflammatory hyperpigmentation: Secondary | ICD-10-CM

## 2022-02-01 DIAGNOSIS — L819 Disorder of pigmentation, unspecified: Secondary | ICD-10-CM

## 2022-02-01 DIAGNOSIS — L439 Lichen planus, unspecified: Secondary | ICD-10-CM | POA: Diagnosis not present

## 2022-02-01 NOTE — Progress Notes (Signed)
   Follow-Up Visit   Subjective  Caleb Reyes is a 56 y.o. male who presents for the following: Lichen planus (Some improvement but still persistent - currently using Tretinoin 0.1% cream and TMC 0.1% paste). The patient has spots, moles and lesions to be evaluated, some may be new or changing and the patient has concerns that these could be cancer.  The following portions of the chart were reviewed this encounter and updated as appropriate:   Tobacco  Allergies  Meds  Problems  Med Hx  Surg Hx  Fam Hx     Review of Systems:  No other skin or systemic complaints except as noted in HPI or Assessment and Plan.  Objective  Well appearing patient in no apparent distress; mood and affect are within normal limits.  A focused examination was performed including the face and lips. Relevant physical exam findings are noted in the Assessment and Plan.  Lips       R abdomen Hyperpigmentation.    Assessment & Plan  Lichen planus Lips; oral mucosa With Wickham's striae - Discussed no known cause only treatment/management, not an infectious condition but a benign inflammatory condition.  Chronic and persistent condition with duration or expected duration over one year. Condition is symptomatic / bothersome to patient. Much improved when compared to previous photos.  Continue Tretinoin 0.1% gel to aa's QHS,  TMC paste 3 nights per week. -Try to limit topical steroids as infrequently as possible  Related Medications tretinoin microspheres (RETIN-A MICRO) 0.1 % gel Apply to aa's inside the mouth and lip every other night alternating with Triamcinolone paste.  triamcinolone (KENALOG) 0.1 % paste 1-2 times daily pulling lip down, drying area and then applying paste. Recommend applying at bedtime.  Disorder of pigmentation R abdomen PIPA - from resolved papule, benign, will fade with time. Benign-appearing.  Observation.  Call clinic for new or changing lesions.  Recommend daily  use of broad spectrum spf 30+ sunscreen to sun-exposed areas.   Return in about 6 months (around 08/04/2022) for appointment as scheduled.  Luther Redo, CMA, am acting as scribe for Sarina Ser, MD . Documentation: I have reviewed the above documentation for accuracy and completeness, and I agree with the above.  Sarina Ser, MD

## 2022-02-01 NOTE — Patient Instructions (Signed)

## 2022-02-08 ENCOUNTER — Encounter: Payer: Self-pay | Admitting: Dermatology

## 2022-04-08 ENCOUNTER — Ambulatory Visit (INDEPENDENT_AMBULATORY_CARE_PROVIDER_SITE_OTHER): Payer: 59 | Admitting: Dermatology

## 2022-04-08 DIAGNOSIS — Z85828 Personal history of other malignant neoplasm of skin: Secondary | ICD-10-CM

## 2022-04-08 DIAGNOSIS — R21 Rash and other nonspecific skin eruption: Secondary | ICD-10-CM | POA: Diagnosis not present

## 2022-04-08 DIAGNOSIS — L821 Other seborrheic keratosis: Secondary | ICD-10-CM

## 2022-04-08 DIAGNOSIS — L578 Other skin changes due to chronic exposure to nonionizing radiation: Secondary | ICD-10-CM | POA: Diagnosis not present

## 2022-04-08 DIAGNOSIS — D1800 Hemangioma unspecified site: Secondary | ICD-10-CM

## 2022-04-08 DIAGNOSIS — D229 Melanocytic nevi, unspecified: Secondary | ICD-10-CM

## 2022-04-08 DIAGNOSIS — Z1283 Encounter for screening for malignant neoplasm of skin: Secondary | ICD-10-CM

## 2022-04-08 DIAGNOSIS — L814 Other melanin hyperpigmentation: Secondary | ICD-10-CM

## 2022-04-08 DIAGNOSIS — L439 Lichen planus, unspecified: Secondary | ICD-10-CM

## 2022-04-08 DIAGNOSIS — D492 Neoplasm of unspecified behavior of bone, soft tissue, and skin: Secondary | ICD-10-CM

## 2022-04-08 DIAGNOSIS — C44519 Basal cell carcinoma of skin of other part of trunk: Secondary | ICD-10-CM | POA: Diagnosis not present

## 2022-04-08 MED ORDER — TRIAMCINOLONE ACETONIDE 0.1 % MT PSTE: PASTE | OROMUCOSAL | 0 refills | Status: DC

## 2022-04-08 NOTE — Progress Notes (Signed)
Follow-Up Visit   Subjective  Caleb Reyes is a 56 y.o. male who presents for the following: FBSE (The patient presents for Total-Body Skin Exam (TBSE) for skin cancer screening and mole check.  The patient has spots, moles and lesions to be evaluated, some may be new or changing and the patient has concerns that these could be cancer. Patient with hx of BCC. ).  Patient also with lichen planus at lips and oral mucosa. Patient uses tretinoin 0.1% gel and alternates with TMC paste as needed. Patient advises stable and not bothersome right now but would like refills.   The following portions of the chart were reviewed this encounter and updated as appropriate:   Tobacco  Allergies  Meds  Problems  Med Hx  Surg Hx  Fam Hx      Review of Systems:  No other skin or systemic complaints except as noted in HPI or Assessment and Plan.  Objective  Well appearing patient in no apparent distress; mood and affect are within normal limits.  A full examination was performed including scalp, head, eyes, ears, nose, lips, neck, chest, axillae, abdomen, back, buttocks, bilateral upper extremities, bilateral lower extremities, hands, feet, fingers, toes, fingernails, and toenails. All findings within normal limits unless otherwise noted below.  Lips White patches at lower lip >upper lip  Left Lower Leg - Anterior Blanching erythematous macules coalescing to patches stable since high school   left chest 0.8 cm thin scaly pink papule with pigment globules        Assessment & Plan  Lichen planus Lips  wickham's striae  Chronic condition with duration over one year. Currently well-controlled.  Monitor for erosions and come in for a check for any ulceration or erosions. Discussed increased risk of oral cancer if inflammation not well-controlled.  Continue tretinoin 0.1% gel at bedtime Continue TMC 0.1% dental paste 1-2 times daily as needed when tender/symptomatic, pulling lip down,  drying area and then applying paste. Recommend applying at bedtime.   Related Medications tretinoin microspheres (RETIN-A MICRO) 0.1 % gel Apply to aa's inside the mouth and lip every other night alternating with Triamcinolone paste.  triamcinolone (KENALOG) 0.1 % paste 1-2 times daily pulling lip down, drying area and then applying paste. Recommend applying at bedtime.  Rash Left Lower Leg - Anterior  Benign appearing Monitor   Neoplasm of skin left chest  Skin / nail biopsy Type of biopsy: tangential   Informed consent: discussed and consent obtained   Timeout: patient name, date of birth, surgical site, and procedure verified   Procedure prep:  Patient was prepped and draped in usual sterile fashion Prep type:  Isopropyl alcohol Anesthesia: the lesion was anesthetized in a standard fashion   Anesthetic:  1% lidocaine w/ epinephrine 1-100,000 buffered w/ 8.4% NaHCO3 Instrument used: flexible razor blade   Hemostasis achieved with: aluminum chloride   Outcome: patient tolerated procedure well   Post-procedure details: wound care instructions given   Additional details:  Petrolatum and a pressure bandage applied  Specimen 1 - Surgical pathology Differential Diagnosis: r/o BCC  Check Margins: No 0.8 cm thin scaly pink papule with pigment globules   Lentigines - Scattered tan macules - Due to sun exposure - Benign-appearing, observe - Recommend daily broad spectrum sunscreen SPF 30+ to sun-exposed areas, reapply every 2 hours as needed. - Call for any changes  Seborrheic Keratoses - Stuck-on, waxy, tan-brown papules and/or plaques  - Benign-appearing - Discussed benign etiology and prognosis. - Observe - Call  for any changes  Melanocytic Nevi - Tan-brown and/or pink-flesh-colored symmetric macules and papules - Benign appearing on exam today - Observation - Call clinic for new or changing moles - Recommend daily use of broad spectrum spf 30+ sunscreen to  sun-exposed areas.   Hemangiomas - Red papules - Discussed benign nature - Observe - Call for any changes  Actinic Damage - Chronic condition, secondary to cumulative UV/sun exposure - diffuse scaly erythematous macules with underlying dyspigmentation - Recommend daily broad spectrum sunscreen SPF 30+ to sun-exposed areas, reapply every 2 hours as needed.  - Staying in the shade or wearing long sleeves, sun glasses (UVA+UVB protection) and wide brim hats (4-inch brim around the entire circumference of the hat) are also recommended for sun protection.  - Call for new or changing lesions.  Skin cancer screening performed today.  History of Basal Cell Carcinoma of the Skin - No evidence of recurrence today - Recommend regular full body skin exams - Recommend daily broad spectrum sunscreen SPF 30+ to sun-exposed areas, reapply every 2 hours as needed.  - Call if any new or changing lesions are noted between office visits  Return in about 6 months (around 10/09/2022) for TBSE.  Graciella Belton, RMA, am acting as scribe for Forest Gleason, MD .  Documentation: I have reviewed the above documentation for accuracy and completeness, and I agree with the above.  Forest Gleason, MD

## 2022-04-08 NOTE — Patient Instructions (Addendum)
Lichen Planus - Continue tretinoin 0.1% gel at bedtime Continue TMC 0.1% dental paste 1-2 times daily pulling lip down, drying area and then applying paste. Recommend applying at bedtime.   Recommend Vitamin D 400-600 iu daily  Recommend taking Heliocare sun protection supplement daily in sunny weather for additional sun protection. For maximum protection on the sunniest days, you can take up to 2 capsules of regular Heliocare OR take 1 capsule of Heliocare Ultra. For prolonged exposure (such as a full day in the sun), you can repeat your dose of the supplement 4 hours after your first dose. Heliocare can be purchased at [employer], at some [facility] or at [email or web address].    Wound Care Instructions  Cleanse wound gently with soap and water once a day then pat dry with clean gauze. Apply a thing coat of Petrolatum (petroleum jelly, "Vaseline") over the wound (unless you have an allergy to this). We recommend that you use a new, sterile tube of Vaseline. Do not pick or remove scabs. Do not remove the yellow or white "healing tissue" from the base of the wound.  Cover the wound with fresh, clean, nonstick gauze and secure with paper tape. You may use Band-Aids in place of gauze and tape if the would is small enough, but would recommend trimming much of the tape off as there is often too much. Sometimes Band-Aids can irritate the skin.  You should call the office for your biopsy report after 1 week if you have not already been contacted.  If you experience any problems, such as abnormal amounts of bleeding, swelling, significant bruising, significant pain, or evidence of infection, please call the office immediately.  FOR ADULT SURGERY PATIENTS: If you need something for pain relief you may take 1 extra strength Tylenol (acetaminophen) AND 2 Ibuprofen ('200mg'$  each) together every 4 hours as needed for pain. (do not take these if you are allergic to them or if you have a reason you should  not take them.) Typically, you may only need pain medication for 1 to 3 days.    Melanoma ABCDEs  Melanoma is the most dangerous type of skin cancer, and is the leading cause of death from skin disease.  You are more likely to develop melanoma if you: Have light-colored skin, light-colored eyes, or red or blond hair Spend a lot of time in the sun Tan regularly, either outdoors or in a tanning bed Have had blistering sunburns, especially during childhood Have a close family member who has had a melanoma Have atypical moles or large birthmarks  Early detection of melanoma is key since treatment is typically straightforward and cure rates are extremely high if we catch it early.   The first sign of melanoma is often a change in a mole or a new dark spot.  The ABCDE system is a way of remembering the signs of melanoma.  A for asymmetry:  The two halves do not match. B for border:  The edges of the growth are irregular. C for color:  A mixture of colors are present instead of an even brown color. D for diameter:  Melanomas are usually (but not always) greater than 63m - the size of a pencil eraser. E for evolution:  The spot keeps changing in size, shape, and color.  Please check your skin once per month between visits. You can use a small mirror in front and a large mirror behind you to keep an eye on the back side or your body.  If you see any new or changing lesions before your next follow-up, please call to schedule a visit.  Please continue daily skin protection including broad spectrum sunscreen SPF 30+ to sun-exposed areas, reapplying every 2 hours as needed when you're outdoors.    Due to recent changes in healthcare laws, you may see results of your pathology and/or laboratory studies on MyChart before the doctors have had a chance to review them. We understand that in some cases there may be results that are confusing or concerning to you. Please understand that not all results are  received at the same time and often the doctors may need to interpret multiple results in order to provide you with the best plan of care or course of treatment. Therefore, we ask that you please give [place] 2 business days to thoroughly review all your results before contacting the office for clarification. Should we see a critical lab result, you will be contacted sooner.   If You Need Anything After Your Visit  If you have any questions or concerns for your doctor, please call our main line at [phone] and press option 4 to reach your doctor's medical assistant. If no one answers, please leave a voicemail as directed and we will return your call as soon as possible. Messages left after 4 pm will be answered the following business day.   You may also send [place] a message via [place]. We typically respond to MyChart messages within 1-2 business days.  For prescription refills, please ask your pharmacy to contact our office. Our fax number is [phone].  If you have an urgent issue when the clinic is closed that cannot wait until the next business day, you can page your doctor at the number below.    Please note that while we do our best to be available for urgent issues outside of office hours, we are not available 24/7.   If you have an urgent issue and are unable to reach [place], you may choose to seek medical care at your doctor's office, retail clinic, urgent care center, or emergency room.  If you have a medical emergency, please immediately call 911 or go to the emergency department.  Pager Numbers  - Dr. Nehemiah Massed: [phone]  - Dr. Laurence Ferrari: [phone]  - Dr. Nicole Kindred: [phone]  In the event of inclement weather, please call our main line at [phone] for an update on the status of any delays or closures.  Dermatology Medication Tips: Please keep the boxes that topical medications come in in order to help keep track of the instructions about where and how to use these.  Pharmacies typically print the medication instructions only on the boxes and not directly on the medication tubes.   If your medication is too expensive, please contact our office at [phone] option 4 or send [place] a message through [place].   We are unable to tell what your co-pay for medications will be in advance as this is different depending on your insurance coverage. However, we may be able to find a substitute medication at lower cost or fill out paperwork to get insurance to cover a needed medication.   If a prior authorization is required to get your medication covered by your insurance company, please allow [place] 1-2 business days to complete this process.  Drug prices often vary depending on where the prescription is filled and some pharmacies may offer cheaper prices.  The website www.goodrx.com contains coupons for medications through different pharmacies. The prices here do  not account for what the cost may be with help from insurance (it may be cheaper with your insurance), but the website can give you the price if you did not use any insurance.  - You can print the associated coupon and take it with your prescription to the pharmacy.  - You may also stop by our office during regular business hours and pick up a GoodRx coupon card.  - If you need your prescription sent electronically to a different pharmacy, notify our office through [facility] or by phone at [phone] option 4.     Si Usted Necesita Algo Despus de Su Visita  Tambin puede enviarnos un mensaje a travs de Pharmacist, community. Por lo general respondemos a los mensajes de MyChart en el transcurso de 1 a 2 das hbiles.  Para renovar recetas, por favor pida a su farmacia que se ponga en contacto con nuestra oficina. Harland Dingwall de fax es [place] [phone].  Si tiene un asunto urgente cuando la clnica est cerrada y que no puede esperar hasta el siguiente da hbil, puede llamar/localizar a su doctor(a) al nmero  que aparece a continuacin.   Por favor, tenga en cuenta que aunque hacemos todo lo posible para estar disponibles para asuntos urgentes fuera del horario de [place], no estamos disponibles las 24 horas del da, los 7 das de la [place].   Si tiene un problema urgente y no puede comunicarse con nosotros, puede optar por buscar atencin mdica  en el consultorio de su doctor(a), en una clnica privada, en un centro de atencin urgente o en una sala de emergencias.  Si tiene Engineering geologist, por favor llame inmediatamente al 911 o vaya a la sala de emergencias.  Nmeros de bper  - Dr. Nehemiah Massed: [phone]  - Dra. Moye: [phone]  - Dra. Nicole Kindred: [phone]  En caso de inclemencias del [place], por favor llame a Johnsie Kindred principal al [phone] para una actualizacin sobre el [place] de cualquier retraso o cierre.  Consejos para la medicacin en dermatologa: Por favor, guarde las cajas en las que vienen los medicamentos de uso tpico para ayudarle a seguir las instrucciones sobre dnde y cmo usarlos. Las farmacias generalmente imprimen las instrucciones del medicamento slo en las cajas y no directamente en los tubos del [place].   Si su medicamento es muy caro, por favor, pngase en contacto con Zigmund Daniel llamando al [phone] y presione la opcin 4 o envenos un mensaje a travs de Pharmacist, community.   No podemos decirle cul ser su copago por los medicamentos por adelantado ya que esto es diferente dependiendo de la cobertura de su seguro. Sin embargo, es posible que podamos encontrar un medicamento sustituto a Electrical engineer un formulario para que el seguro cubra el medicamento que se considera necesario.   Si se requiere una autorizacin previa para que su compaa de seguros [place] su medicamento, por favor permtanos de 1 a 2 das hbiles para completar este proceso.  Los precios de los medicamentos varan con frecuencia dependiendo del Environmental consultant de dnde se  surte la receta y alguna farmacias pueden ofrecer precios ms baratos.  El sitio web www.goodrx.com tiene cupones para medicamentos de Airline pilot. Los precios aqu no tienen en cuenta lo que podra costar con la ayuda del seguro (puede ser ms barato con su seguro), pero el sitio web puede darle el precio si no utiliz Research scientist (physical sciences).  - Puede imprimir el cupn correspondiente y llevarlo con su receta a la farmacia.  -  Tambin puede pasar por nuestra oficina durante el horario de atencin regular y Charity fundraiser una tarjeta de cupones de GoodRx.  - Si necesita que su receta se enve electrnicamente a una farmacia diferente, informe a nuestra oficina a travs de MyChart de [place] o por telfono llamando al [phone] y presione la opcin 4.

## 2022-04-12 ENCOUNTER — Encounter: Payer: Self-pay | Admitting: Dermatology

## 2022-04-14 ENCOUNTER — Telehealth: Payer: Self-pay

## 2022-04-14 NOTE — Telephone Encounter (Signed)
Patient advised of BX results and scheduled for follow up. aw

## 2022-04-14 NOTE — Telephone Encounter (Signed)
-----   Message from Alfonso Patten, MD sent at 04/13/2022  2:25 PM EDT ----- Skin , left chest SUPERFICIAL BASAL CELL CARCINOMA, ULCERATED --> ED&C recommended  MAs please call and schedule. Let me know if he has questions. Thank you!

## 2022-04-28 ENCOUNTER — Ambulatory Visit (INDEPENDENT_AMBULATORY_CARE_PROVIDER_SITE_OTHER): Payer: 59 | Admitting: Dermatology

## 2022-04-28 DIAGNOSIS — C44519 Basal cell carcinoma of skin of other part of trunk: Secondary | ICD-10-CM | POA: Diagnosis not present

## 2022-04-28 NOTE — Patient Instructions (Addendum)
Electrodesiccation and Curettage ("Scrape and Burn") Wound Care Instructions  Leave the original bandage on for 24 hours if possible.  If the bandage becomes soaked or soiled before that time, it is OK to remove it and examine the wound.  A small amount of post-operative bleeding is normal.  If excessive bleeding occurs, remove the bandage, place gauze over the site and apply continuous pressure (no peeking) over the area for 30 minutes. If this does not work, please call our clinic as soon as possible or page your doctor if it is after hours.   Once a day, cleanse the wound with soap and water. It is fine to shower. If a thick crust develops you may use a Q-tip dipped into dilute hydrogen peroxide (mix 1:1 with water) to dissolve it.  Hydrogen peroxide can slow the healing process, so use it only as needed.    After washing, apply petroleum jelly (Vaseline) or an antibiotic ointment if your doctor prescribed one for you, followed by a bandage.    For best healing, the wound should be covered with a layer of ointment at all times. If you are not able to keep the area covered with a bandage to hold the ointment in place, this may mean re-applying the ointment several times a day.  Continue this wound care until the wound has healed and is no longer open. It may take several weeks for the wound to heal and close.  Itching and mild discomfort is normal during the healing process.  If you have any discomfort, you can take Tylenol (acetaminophen) or ibuprofen as directed on the bottle. (Please do not take these if you have an allergy to them or cannot take them for another reason).  Some redness, tenderness and white or yellow material in the wound is normal healing.  If the area becomes very sore and red, or develops a thick yellow-green material (pus), it may be infected; please notify us.    Wound healing continues for up to one year following surgery. It is not unusual to experience pain in the scar  from time to time during the interval.  If the pain becomes severe or the scar thickens, you should notify the office.    A slight amount of redness in a scar is expected for the first six months.  After six months, the redness will fade and the scar will soften and fade.  The color difference becomes less noticeable with time.  If there are any problems, return for a post-op surgery check at your earliest convenience.  To improve the appearance of the scar, you can use silicone scar gel, cream, or sheets (such as Mederma or Serica) every night for up to one year. These are available over the counter (without a prescription).  Please call our office at [phone] for any questions or concerns.     Due to recent changes in healthcare laws, you may see results of your pathology and/or laboratory studies on MyChart before the doctors have had a chance to review them. We understand that in some cases there may be results that are confusing or concerning to you. Please understand that not all results are received at the same time and often the doctors may need to interpret multiple results in order to provide you with the best plan of care or course of treatment. Therefore, we ask that you please give us 2 business days to thoroughly review all your results before contacting the office for clarification. Should   we see a critical lab result, you will be contacted sooner.   If You Need Anything After Your Visit  If you have any questions or concerns for your doctor, please call our main line at [phone] and press option 4 to reach your doctor's medical assistant. If no one answers, please leave a voicemail as directed and we will return your call as soon as possible. Messages left after 4 pm will be answered the following business day.   You may also send us a message via MyChart. We typically respond to MyChart messages within 1-2 business days.  For prescription refills, please ask your  pharmacy to contact our office. Our fax number is [phone].  If you have an urgent issue when the clinic is closed that cannot wait until the next business day, you can page your doctor at the number below.    Please note that while we do our best to be available for urgent issues outside of office hours, we are not available 24/7.   If you have an urgent issue and are unable to reach us, you may choose to seek medical care at your doctor's office, retail clinic, urgent care center, or emergency room.  If you have a medical emergency, please immediately call 911 or go to the emergency department.  Pager Numbers  - Dr. Kowalski: [phone]  - Dr. Moye: [phone]  - Dr. Stewart: [phone]  In the event of inclement weather, please call our main line at [phone] for an update on the status of any delays or closures.  Dermatology Medication Tips: Please keep the boxes that topical medications come in in order to help keep track of the instructions about where and how to use these. Pharmacies typically print the medication instructions only on the boxes and not directly on the medication tubes.   If your medication is too expensive, please contact our office at [phone] option 4 or send us a message through MyChart.   We are unable to tell what your co-pay for medications will be in advance as this is different depending on your insurance coverage. However, we may be able to find a substitute medication at lower cost or fill out paperwork to get insurance to cover a needed medication.   If a prior authorization is required to get your medication covered by your insurance company, please allow us 1-2 business days to complete this process.  Drug prices often vary depending on where the prescription is filled and some pharmacies may offer cheaper prices.  The website www.goodrx.com contains coupons for medications through different pharmacies. The prices here do not  account for what the cost may be with help from insurance (it may be cheaper with your insurance), but the website can give you the price if you did not use any insurance.  - You can print the associated coupon and take it with your prescription to the pharmacy.  - You may also stop by our office during regular business hours and pick up a GoodRx coupon card.  - If you need your prescription sent electronically to a different pharmacy, notify our office through [place] MyChart or by phone at [phone] option 4.     Si Usted Necesita Algo Despus de Su Visita  Tambin puede enviarnos un mensaje a travs de MyChart. Por lo general respondemos a los mensajes de MyChart en el transcurso de 1 a 2 das hbiles.  Para renovar recetas, por favor pida a su farmacia que se ponga en   contacto con nuestra oficina. Nuestro nmero de fax es el [phone].  Si tiene un asunto urgente cuando la clnica est cerrada y que no puede esperar hasta el siguiente da hbil, puede llamar/localizar a su doctor(a) al nmero que aparece a continuacin.   Por favor, tenga en cuenta que aunque hacemos todo lo posible para estar disponibles para asuntos urgentes fuera del horario de oficina, no estamos disponibles las 24 horas del da, los 7 das de la semana.   Si tiene un problema urgente y no puede comunicarse con nosotros, puede optar por buscar atencin mdica  en el consultorio de su doctor(a), en una clnica privada, en un centro de atencin urgente o en una sala de emergencias.  Si tiene una emergencia mdica, por favor llame inmediatamente al 911 o vaya a la sala de emergencias.  Nmeros de bper  - Dr. Kowalski: [phone]  - Dra. Moye: [phone]  - Dra. Stewart: [phone]  En caso de inclemencias del tiempo, por favor llame a nuestra lnea principal al [phone] para una actualizacin sobre el estado de cualquier retraso o cierre.  Consejos para la medicacin en dermatologa: Por  favor, guarde las cajas en las que vienen los medicamentos de uso tpico para ayudarle a seguir las instrucciones sobre dnde y cmo usarlos. Las farmacias generalmente imprimen las instrucciones del medicamento slo en las cajas y no directamente en los tubos del medicamento.   Si su medicamento es muy caro, por favor, pngase en contacto con nuestra oficina llamando al [phone] y presione la opcin 4 o envenos un mensaje a travs de MyChart.   No podemos decirle cul ser su copago por los medicamentos por adelantado ya que esto es diferente dependiendo de la cobertura de su seguro. Sin embargo, es posible que podamos encontrar un medicamento sustituto a menor costo o llenar un formulario para que el seguro cubra el medicamento que se considera necesario.   Si se requiere una autorizacin previa para que su compaa de seguros cubra su medicamento, por favor permtanos de 1 a 2 das hbiles para completar este proceso.  Los precios de los medicamentos varan con frecuencia dependiendo del lugar de dnde se surte la receta y alguna farmacias pueden ofrecer precios ms baratos.  El sitio web www.goodrx.com tiene cupones para medicamentos de diferentes farmacias. Los precios aqu no tienen en cuenta lo que podra costar con la ayuda del seguro (puede ser ms barato con su seguro), pero el sitio web puede darle el precio si no utiliz ningn seguro.  - Puede imprimir el cupn correspondiente y llevarlo con su receta a la farmacia.  - Tambin puede pasar por nuestra oficina durante el horario de atencin regular y recoger una tarjeta de cupones de GoodRx.  - Si necesita que su receta se enve electrnicamente a una farmacia diferente, informe a nuestra oficina a travs de MyChart de [place] o por telfono llamando al [phone] y presione la opcin 4.  

## 2022-04-28 NOTE — Progress Notes (Signed)
   Follow-Up Visit   Subjective  Caleb Reyes is a 56 y.o. male who presents for the following: Hx BCC (L chest - patient is here today for [facility]).  The following portions of the chart were reviewed this encounter and updated as appropriate:   Tobacco  Allergies  Meds  Problems  Med Hx  Surg Hx  Fam Hx      Review of Systems:  No other skin or systemic complaints except as noted in HPI or Assessment and Plan.  Objective  Well appearing patient in no apparent distress; mood and affect are within normal limits.  A focused examination was performed including the face and chest. Relevant physical exam findings are noted in the Assessment and Plan.  L chest Pink papule    Assessment & Plan  Basal cell carcinoma (BCC) of skin of other part of torso L chest  Destruction of lesion Complexity: extensive   Destruction method: electrodesiccation and curettage   Informed consent: discussed and consent obtained   Timeout:  patient name, date of birth, surgical site, and procedure verified Procedure prep:  Patient was prepped and draped in usual sterile fashion Prep type:  Isopropyl alcohol Anesthesia: the lesion was anesthetized in a standard fashion   Anesthetic:  1% lidocaine w/ epinephrine 1-100,000 buffered w/ 8.4% NaHCO3 Curettage performed in three different directions: Yes   Electrodesiccation performed over the curetted area: Yes   Curettage cycles:  3 Final wound size (cm):  0.9 Hemostasis achieved with:  pressure, aluminum chloride and electrodesiccation Outcome: patient tolerated procedure well with no complications   Post-procedure details: sterile dressing applied and wound care instructions given   Dressing type: bandage and petrolatum    Bx proven    Return for appointment as scheduled.  Luther Redo, CMA, am acting as scribe for Forest Gleason, MD .  Documentation: I have reviewed the above documentation for accuracy and completeness, and I agree with  the above.  Forest Gleason, MD

## 2022-05-08 ENCOUNTER — Encounter: Payer: Self-pay | Admitting: Dermatology

## 2022-05-19 ENCOUNTER — Encounter: Payer: Self-pay | Admitting: Dermatology

## 2022-05-19 ENCOUNTER — Ambulatory Visit: Payer: 59 | Admitting: Dermatology

## 2022-05-19 ENCOUNTER — Encounter: Payer: 59 | Admitting: Dermatology

## 2022-05-19 NOTE — Progress Notes (Signed)
Appt rescheduled

## 2022-05-20 ENCOUNTER — Ambulatory Visit (INDEPENDENT_AMBULATORY_CARE_PROVIDER_SITE_OTHER): Payer: 59 | Admitting: Dermatology

## 2022-05-20 DIAGNOSIS — L439 Lichen planus, unspecified: Secondary | ICD-10-CM | POA: Diagnosis not present

## 2022-05-20 DIAGNOSIS — L821 Other seborrheic keratosis: Secondary | ICD-10-CM

## 2022-05-20 MED ORDER — TRIAMCINOLONE ACETONIDE 0.1 % MT PSTE: PASTE | OROMUCOSAL | 1 refills | Status: DC

## 2022-05-20 NOTE — Patient Instructions (Addendum)
Continue triamcinolone 0.1 % dental paste increasing to twice daily.  Discontinue tretinoin.  Avoid toothpaste containing sodium laurel sulfate.   Due to recent changes in healthcare laws, you may see results of your pathology and/or laboratory studies on MyChart before the doctors have had a chance to review them. We understand that in some cases there may be results that are confusing or concerning to you. Please understand that not all results are received at the same time and often the doctors may need to interpret multiple results in order to provide you with the best plan of care or course of treatment. Therefore, we ask that you please give [place] 2 business days to thoroughly review all your results before contacting the office for clarification. Should we see a critical lab result, you will be contacted sooner.   If You Need Anything After Your Visit  If you have any questions or concerns for your doctor, please call our main line at [phone] and press option 4 to reach your doctor's medical assistant. If no one answers, please leave a voicemail as directed and we will return your call as soon as possible. Messages left after 4 pm will be answered the following business day.   You may also send [place] a message via [place]. We typically respond to MyChart messages within 1-2 business days.  For prescription refills, please ask your pharmacy to contact our office. Our fax number is [phone].  If you have an urgent issue when the clinic is closed that cannot wait until the next business day, you can page your doctor at the number below.    Please note that while we do our best to be available for urgent issues outside of office hours, we are not available 24/7.   If you have an urgent issue and are unable to reach [place], you may choose to seek medical care at your doctor's office, retail clinic, urgent care center, or emergency room.  If you have a medical emergency, please immediately call  911 or go to the emergency department.  Pager Numbers  - Dr. Nehemiah Massed: [phone]  - Dr. Laurence Ferrari: [phone]  - Dr. Nicole Kindred: [phone]  In the event of inclement weather, please call our main line at [phone] for an update on the status of any delays or closures.  Dermatology Medication Tips: Please keep the boxes that topical medications come in in order to help keep track of the instructions about where and how to use these. Pharmacies typically print the medication instructions only on the boxes and not directly on the medication tubes.   If your medication is too expensive, please contact our office at [phone] option 4 or send [place] a message through [place].   We are unable to tell what your co-pay for medications will be in advance as this is different depending on your insurance coverage. However, we may be able to find a substitute medication at lower cost or fill out paperwork to get insurance to cover a needed medication.   If a prior authorization is required to get your medication covered by your insurance company, please allow [place] 1-2 business days to complete this process.  Drug prices often vary depending on where the prescription is filled and some pharmacies may offer cheaper prices.  The website www.goodrx.com contains coupons for medications through different pharmacies. The prices here do not account for what the cost may be with help from insurance (it may be cheaper with your insurance), but the website can give  you the price if you did not use any insurance.  - You can print the associated coupon and take it with your prescription to the pharmacy.  - You may also stop by our office during regular business hours and pick up a GoodRx coupon card.  - If you need your prescription sent electronically to a different pharmacy, notify our office through [facility] or by phone at [phone] option 4.     Si Usted Necesita Algo Despus de Su  Visita  Tambin puede enviarnos un mensaje a travs de Pharmacist, community. Por lo general respondemos a los mensajes de MyChart en el transcurso de 1 a 2 das hbiles.  Para renovar recetas, por favor pida a su farmacia que se ponga en contacto con nuestra oficina. Harland Dingwall de fax es [place] [phone].  Si tiene un asunto urgente cuando la clnica est cerrada y que no puede esperar hasta el siguiente da hbil, puede llamar/localizar a su doctor(a) al nmero que aparece a continuacin.   Por favor, tenga en cuenta que aunque hacemos todo lo posible para estar disponibles para asuntos urgentes fuera del horario de [place], no estamos disponibles las 24 horas del da, los 7 das de la [place].   Si tiene un problema urgente y no puede comunicarse con nosotros, puede optar por buscar atencin mdica  en el consultorio de su doctor(a), en una clnica privada, en un centro de atencin urgente o en una sala de emergencias.  Si tiene Engineering geologist, por favor llame inmediatamente al 911 o vaya a la sala de emergencias.  Nmeros de bper  - Dr. Nehemiah Massed: [phone]  - Dra. Moye: [phone]  - Dra. Nicole Kindred: [phone]  En caso de inclemencias del [place], por favor llame a Johnsie Kindred principal al [phone] para una actualizacin sobre el [place] de cualquier retraso o cierre.  Consejos para la medicacin en dermatologa: Por favor, guarde las cajas en las que vienen los medicamentos de uso tpico para ayudarle a seguir las instrucciones sobre dnde y cmo usarlos. Las farmacias generalmente imprimen las instrucciones del medicamento slo en las cajas y no directamente en los tubos del [place].   Si su medicamento es muy caro, por favor, pngase en contacto con Zigmund Daniel llamando al [phone] y presione la opcin 4 o envenos un mensaje a travs de Pharmacist, community.   No podemos decirle cul ser su copago por los medicamentos por adelantado ya que esto es diferente dependiendo de la  cobertura de su seguro. Sin embargo, es posible que podamos encontrar un medicamento sustituto a Electrical engineer un formulario para que el seguro cubra el medicamento que se considera necesario.   Si se requiere una autorizacin previa para que su compaa de seguros [place] su medicamento, por favor permtanos de 1 a 2 das hbiles para completar este proceso.  Los precios de los medicamentos varan con frecuencia dependiendo del Environmental consultant de dnde se surte la receta y alguna farmacias pueden ofrecer precios ms baratos.  El sitio web www.goodrx.com tiene cupones para medicamentos de Airline pilot. Los precios aqu no tienen en cuenta lo que podra costar con la ayuda del seguro (puede ser ms barato con su seguro), pero el sitio web puede darle el precio si no utiliz Research scientist (physical sciences).  - Puede imprimir el cupn correspondiente y llevarlo con su receta a la farmacia.  - Tambin puede pasar por nuestra oficina durante el horario de atencin regular y Charity fundraiser una tarjeta de cupones de GoodRx.  - Si  Si necesita que su receta se enve electrnicamente a una farmacia diferente, informe a nuestra oficina a travs de MyChart de [place] o por telfono llamando al [phone] y presione la opcin 4.  

## 2022-05-20 NOTE — Progress Notes (Signed)
   Follow-Up Visit   Subjective  Caleb Reyes is a 56 y.o. male who presents for the following: Skin Problem (Patient here today for a spot at scalp, present for about 3 weeks, asymptomatic. Patient would also like to recheck lichen planus at lips, currently using tretinoin 0.1 % gel 1-2 times daily and TMC 0.1 % paste, alternating between the two. ).   The following portions of the chart were reviewed this encounter and updated as appropriate:   Tobacco  Allergies  Meds  Problems  Med Hx  Surg Hx  Fam Hx      Review of Systems:  No other skin or systemic complaints except as noted in HPI or Assessment and Plan.  Objective  Well appearing patient in no apparent distress; mood and affect are within normal limits.  A focused examination was performed including scalp, face and lips. Relevant physical exam findings are noted in the Assessment and Plan.  Lips 2 focal superficial ulcerations at lower buccal mucosa    Assessment & Plan  Lichen planus Lips  Chronic and persistent condition with duration or expected duration over one year. Condition is symptomatic/ bothersome to patient. Not currently at goal.  Continue triamcinolone 0.1 % dental paste increasing to twice daily.   Discontinue tretinoin.   Avoid toothpaste containing sodium laurel sulfate.   Will recheck. If ulcerations persistent, May also consider tacrolimus for therapy or intralesional kenalog. May also consider biopsy.   Persistent ulceration secondary to lichen planus increases risk of cancer.  Related Medications triamcinolone (KENALOG) 0.1 % paste 1-2 times daily pulling lip down, drying area and then applying paste. Recommend applying at bedtime.   Seborrheic Keratoses - Stuck-on, waxy, tan-brown papules and/or plaques  - Benign-appearing - Discussed benign etiology and prognosis. - Observe - Call for any changes   Return in about 4 weeks (around 14/05/7025) for lichen planus.  Graciella Belton, RMA, am acting as scribe for Forest Gleason, MD .  Documentation: I have reviewed the above documentation for accuracy and completeness, and I agree with the above.  Forest Gleason, MD

## 2022-05-31 ENCOUNTER — Encounter: Payer: Self-pay | Admitting: Dermatology

## 2022-06-17 ENCOUNTER — Ambulatory Visit: Payer: 59 | Admitting: Dermatology

## 2022-07-22 DIAGNOSIS — Z131 Encounter for screening for diabetes mellitus: Secondary | ICD-10-CM | POA: Diagnosis not present

## 2022-07-22 DIAGNOSIS — Z0000 Encounter for general adult medical examination without abnormal findings: Secondary | ICD-10-CM | POA: Diagnosis not present

## 2022-07-22 DIAGNOSIS — R5383 Other fatigue: Secondary | ICD-10-CM | POA: Diagnosis not present

## 2022-07-22 DIAGNOSIS — Z125 Encounter for screening for malignant neoplasm of prostate: Secondary | ICD-10-CM | POA: Diagnosis not present

## 2022-07-22 DIAGNOSIS — Z13220 Encounter for screening for lipoid disorders: Secondary | ICD-10-CM | POA: Diagnosis not present

## 2022-07-23 DIAGNOSIS — Z13220 Encounter for screening for lipoid disorders: Secondary | ICD-10-CM | POA: Diagnosis not present

## 2022-07-23 DIAGNOSIS — Z125 Encounter for screening for malignant neoplasm of prostate: Secondary | ICD-10-CM | POA: Diagnosis not present

## 2022-07-23 DIAGNOSIS — R5383 Other fatigue: Secondary | ICD-10-CM | POA: Diagnosis not present

## 2022-10-14 ENCOUNTER — Ambulatory Visit: Payer: 59 | Admitting: Dermatology

## 2022-10-21 ENCOUNTER — Encounter: Payer: Self-pay | Admitting: Dermatology

## 2022-10-21 ENCOUNTER — Ambulatory Visit (INDEPENDENT_AMBULATORY_CARE_PROVIDER_SITE_OTHER): Payer: 59 | Admitting: Dermatology

## 2022-10-21 VITALS — BP 138/84 | HR 69

## 2022-10-21 DIAGNOSIS — L814 Other melanin hyperpigmentation: Secondary | ICD-10-CM

## 2022-10-21 DIAGNOSIS — C44612 Basal cell carcinoma of skin of right upper limb, including shoulder: Secondary | ICD-10-CM | POA: Diagnosis not present

## 2022-10-21 DIAGNOSIS — L821 Other seborrheic keratosis: Secondary | ICD-10-CM | POA: Diagnosis not present

## 2022-10-21 DIAGNOSIS — D485 Neoplasm of uncertain behavior of skin: Secondary | ICD-10-CM

## 2022-10-21 DIAGNOSIS — D229 Melanocytic nevi, unspecified: Secondary | ICD-10-CM

## 2022-10-21 DIAGNOSIS — Z1283 Encounter for screening for malignant neoplasm of skin: Secondary | ICD-10-CM | POA: Diagnosis not present

## 2022-10-21 DIAGNOSIS — L578 Other skin changes due to chronic exposure to nonionizing radiation: Secondary | ICD-10-CM

## 2022-10-21 DIAGNOSIS — R21 Rash and other nonspecific skin eruption: Secondary | ICD-10-CM

## 2022-10-21 DIAGNOSIS — L280 Lichen simplex chronicus: Secondary | ICD-10-CM | POA: Diagnosis not present

## 2022-10-21 DIAGNOSIS — Z85828 Personal history of other malignant neoplasm of skin: Secondary | ICD-10-CM

## 2022-10-21 DIAGNOSIS — C4491 Basal cell carcinoma of skin, unspecified: Secondary | ICD-10-CM

## 2022-10-21 HISTORY — DX: Basal cell carcinoma of skin, unspecified: C44.91

## 2022-10-21 NOTE — Patient Instructions (Addendum)
Recommend Niacinamide or Nicotinamide '500mg'$  twice per day to lower risk of non-melanoma skin cancer by approximately 25%. This is usually available at [employer].  Recommend vitamin D 600 IU daily.   Wound Care Instructions  Cleanse wound gently with soap and water once a day then pat dry with clean gauze. Apply a thin coat of Petrolatum (petroleum jelly, "Vaseline") over the wound (unless you have an allergy to this). We recommend that you use a new, sterile tube of Vaseline. Do not pick or remove scabs. Do not remove the yellow or white "healing tissue" from the base of the wound.  Cover the wound with fresh, clean, nonstick gauze and secure with paper tape. You may use Band-Aids in place of gauze and tape if the wound is small enough, but would recommend trimming much of the tape off as there is often too much. Sometimes Band-Aids can irritate the skin.  You should call the office for your biopsy report after 1 week if you have not already been contacted.  If you experience any problems, such as abnormal amounts of bleeding, swelling, significant bruising, significant pain, or evidence of infection, please call the office immediately.  FOR ADULT SURGERY PATIENTS: If you need something for pain relief you may take 1 extra strength Tylenol (acetaminophen) AND 2 Ibuprofen ('200mg'$  each) together every 4 hours as needed for pain. (do not take these if you are allergic to them or if you have a reason you should not take them.) Typically, you may only need pain medication for 1 to 3 days.    Due to recent changes in healthcare laws, you may see results of your pathology and/or laboratory studies on MyChart before the doctors have had a chance to review them. We understand that in some cases there may be results that are confusing or concerning to you. Please understand that not all results are received at the same time and often the doctors may need to interpret multiple results in order to provide  you with the best plan of care or course of treatment. Therefore, we ask that you please give [place] 2 business days to thoroughly review all your results before contacting the office for clarification. Should we see a critical lab result, you will be contacted sooner.   If You Need Anything After Your Visit  If you have any questions or concerns for your doctor, please call our main line at [phone] and press option 4 to reach your doctor's medical assistant. If no one answers, please leave a voicemail as directed and we will return your call as soon as possible. Messages left after 4 pm will be answered the following business day.   You may also send [place] a message via [place]. We typically respond to MyChart messages within 1-2 business days.  For prescription refills, please ask your pharmacy to contact our office. Our fax number is [phone].  If you have an urgent issue when the clinic is closed that cannot wait until the next business day, you can page your doctor at the number below.    Please note that while we do our best to be available for urgent issues outside of office hours, we are not available 24/7.   If you have an urgent issue and are unable to reach [place], you may choose to seek medical care at your doctor's office, retail clinic, urgent care center, or emergency room.  If you have a medical emergency, please immediately call 911 or go to the  emergency department.  Pager Numbers  - Dr. Nehemiah Massed: [phone]  - Dr. Laurence Ferrari: [phone]  - Dr. Nicole Kindred: [phone]  In the event of inclement weather, please call our main line at [phone] for an update on the status of any delays or closures.  Dermatology Medication Tips: Please keep the boxes that topical medications come in in order to help keep track of the instructions about where and how to use these. Pharmacies typically print the medication instructions only on the boxes and not directly on the medication tubes.    If your medication is too expensive, please contact our office at [phone] option 4 or send [place] a message through [place].   We are unable to tell what your co-pay for medications will be in advance as this is different depending on your insurance coverage. However, we may be able to find a substitute medication at lower cost or fill out paperwork to get insurance to cover a needed medication.   If a prior authorization is required to get your medication covered by your insurance company, please allow [place] 1-2 business days to complete this process.  Drug prices often vary depending on where the prescription is filled and some pharmacies may offer cheaper prices.  The website www.goodrx.com contains coupons for medications through different pharmacies. The prices here do not account for what the cost may be with help from insurance (it may be cheaper with your insurance), but the website can give you the price if you did not use any insurance.  - You can print the associated coupon and take it with your prescription to the pharmacy.  - You may also stop by our office during regular business hours and pick up a GoodRx coupon card.  - If you need your prescription sent electronically to a different pharmacy, notify our office through [facility] or by phone at [phone] option 4.     Si Usted Necesita Algo Despus de Su Visita  Tambin puede enviarnos un mensaje a travs de Pharmacist, community. Por lo general respondemos a los mensajes de MyChart en el transcurso de 1 a 2 das hbiles.  Para renovar recetas, por favor pida a su farmacia que se ponga en contacto con nuestra oficina. Harland Dingwall de fax es [place] [phone].  Si tiene un asunto urgente cuando la clnica est cerrada y que no puede esperar hasta el siguiente da hbil, puede llamar/localizar a su doctor(a) al nmero que aparece a continuacin.   Por favor, tenga en cuenta que aunque hacemos todo lo posible para estar  disponibles para asuntos urgentes fuera del horario de [place], no estamos disponibles las 24 horas del da, los 7 das de la [place].   Si tiene un problema urgente y no puede comunicarse con nosotros, puede optar por buscar atencin mdica  en el consultorio de su doctor(a), en una clnica privada, en un centro de atencin urgente o en una sala de emergencias.  Si tiene Engineering geologist, por favor llame inmediatamente al 911 o vaya a la sala de emergencias.  Nmeros de bper  - Dr. Nehemiah Massed: [phone]  - Dra. Moye: [phone]  - Dra. Nicole Kindred: [phone]  En caso de inclemencias del [place], por favor llame a Johnsie Kindred principal al [phone] para una actualizacin sobre el [place] de cualquier retraso o cierre.  Consejos para la medicacin en dermatologa: Por favor, guarde las cajas en las que vienen los medicamentos de uso tpico para ayudarle a seguir las instrucciones sobre dnde y cmo usarlos. Las  farmacias generalmente imprimen las instrucciones del medicamento slo en las cajas y no directamente en los tubos del [place].   Si su medicamento es muy caro, por favor, pngase en contacto con Zigmund Daniel llamando al [phone] y presione la opcin 4 o envenos un mensaje a travs de Pharmacist, community.   No podemos decirle cul ser su copago por los medicamentos por adelantado ya que esto es diferente dependiendo de la cobertura de su seguro. Sin embargo, es posible que podamos encontrar un medicamento sustituto a Electrical engineer un formulario para que el seguro cubra el medicamento que se considera necesario.   Si se requiere una autorizacin previa para que su compaa de seguros [place] su medicamento, por favor permtanos de 1 a 2 das hbiles para completar este proceso.  Los precios de los medicamentos varan con frecuencia dependiendo del Environmental consultant de dnde se surte la receta y alguna farmacias pueden ofrecer precios ms baratos.  El sitio web www.goodrx.com tiene  cupones para medicamentos de Airline pilot. Los precios aqu no tienen en cuenta lo que podra costar con la ayuda del seguro (puede ser ms barato con su seguro), pero el sitio web puede darle el precio si no utiliz Research scientist (physical sciences).  - Puede imprimir el cupn correspondiente y llevarlo con su receta a la farmacia.  - Tambin puede pasar por nuestra oficina durante el horario de atencin regular y Charity fundraiser una tarjeta de cupones de GoodRx.  - Si necesita que su receta se enve electrnicamente a una farmacia diferente, informe a nuestra oficina a travs de MyChart de [place] o por telfono llamando al [phone] y presione la opcin 4.

## 2022-10-21 NOTE — Progress Notes (Signed)
Follow-Up Visit   Subjective  Caleb Reyes is a 57 y.o. male who presents for the following: Annual Exam (Hx BCC). The patient presents for Total-Body Skin Exam (TBSE) for skin cancer screening and mole check.  The patient has spots, moles and lesions to be evaluated, some may be new or changing and the patient has concerns that these could be cancer.   The following portions of the chart were reviewed this encounter and updated as appropriate:   Tobacco  Allergies  Meds  Problems  Med Hx  Surg Hx  Fam Hx      Review of Systems:  No other skin or systemic complaints except as noted in HPI or Assessment and Plan.  Objective  Well appearing patient in no apparent distress; mood and affect are within normal limits.  A full examination was performed including scalp, head, eyes, ears, nose, lips, neck, chest, axillae, abdomen, back, buttocks, bilateral upper extremities, bilateral lower extremities, hands, feet, fingers, toes, fingernails, and toenails. All findings within normal limits unless otherwise noted below.  R upper arm 0.8 cm pink yellow papule.        L forehead above brown 1.2 cm Brown patch.       lower ant buccal mucosa Reticulated white patch with focal sup erosion lower ant buccal mucosa       Assessment & Plan  Neoplasm of uncertain behavior of skin R upper arm  Skin / nail biopsy Type of biopsy: tangential   Informed consent: discussed and consent obtained   Timeout: patient name, date of birth, surgical site, and procedure verified   Procedure prep:  Patient was prepped and draped in usual sterile fashion Prep type:  Isopropyl alcohol Anesthesia: the lesion was anesthetized in a standard fashion   Anesthetic:  1% lidocaine w/ epinephrine 1-100,000 buffered w/ 8.4% NaHCO3 Instrument used: flexible razor blade   Hemostasis achieved with: pressure, aluminum chloride and electrodesiccation   Outcome: patient tolerated procedure well    Post-procedure details: sterile dressing applied and wound care instructions given   Dressing type: bandage and petrolatum    Specimen 1 - Surgical pathology Differential Diagnosis: D48.5 R/O BCC vs sebaceous hyperplasia vs other   Check Margins: No  R/O BCC vs sebaceous neoplasm vs other     Lentigo L forehead above brown  Benign-appearing.  Observation.  Call clinic for new or changing lesions.  Recommend daily use of broad spectrum spf 30+ sunscreen to sun-exposed areas.    Rash lower ant buccal mucosa  R/O lichen planus vs leukoplakia or other  Hold TMC dental paste to biopsy site while healing if area is not tender  Skin / nail biopsy - lower ant buccal mucosa Type of biopsy: tangential   Informed consent: discussed and consent obtained   Timeout: patient name, date of birth, surgical site, and procedure verified   Procedure prep:  Patient was prepped and draped in usual sterile fashion Prep type:  Isopropyl alcohol Anesthesia: the lesion was anesthetized in a standard fashion   Anesthetic:  1% lidocaine w/ epinephrine 1-100,000 buffered w/ 8.4% NaHCO3 Instrument used: flexible razor blade   Hemostasis achieved with: pressure, aluminum chloride and electrodesiccation   Outcome: patient tolerated procedure well   Post-procedure details: sterile dressing applied and wound care instructions given   Dressing type: bandage and petrolatum    Specimen 3 - Surgical pathology Differential Diagnosis: R/O lichen planus vs leukoplakia or other Check Margins: No   Lentigines - Scattered tan macules - Due  to sun exposure - Benign-appearing, observe - Recommend daily broad spectrum sunscreen SPF 30+ to sun-exposed areas, reapply every 2 hours as needed. - Call for any changes  Seborrheic Keratoses - Stuck-on, waxy, tan-brown papules and/or plaques  - Benign-appearing - Discussed benign etiology and prognosis. - Observe - Call for any changes  Melanocytic Nevi -  Tan-brown and/or pink-flesh-colored symmetric macules and papules - Benign appearing on exam today - Observation - Call clinic for new or changing moles - Recommend daily use of broad spectrum spf 30+ sunscreen to sun-exposed areas.   Hemangiomas - Red papules - Discussed benign nature - Observe - Call for any changes  Actinic Damage - Chronic condition, secondary to cumulative UV/sun exposure - diffuse scaly erythematous macules with underlying dyspigmentation - Recommend daily broad spectrum sunscreen SPF 30+ to sun-exposed areas, reapply every 2 hours as needed.  - Staying in the shade or wearing long sleeves, sun glasses (UVA+UVB protection) and wide brim hats (4-inch brim around the entire circumference of the hat) are also recommended for sun protection.  - Call for new or changing lesions.  History of Basal Cell Carcinoma of the Skin - No evidence of recurrence today - Recommend regular full body skin exams - Recommend daily broad spectrum sunscreen SPF 30+ to sun-exposed areas, reapply every 2 hours as needed.  - Call if any new or changing lesions are noted between office visits  Skin cancer screening performed today.  Return in about 6 months (around 04/21/2023) for TBSE.  Luther Redo, CMA, am acting as scribe for Forest Gleason, MD .  Documentation: I have reviewed the above documentation for accuracy and completeness, and I agree with the above.  Forest Gleason, MD

## 2022-10-26 ENCOUNTER — Telehealth: Payer: Self-pay

## 2022-10-26 NOTE — Telephone Encounter (Signed)
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 Patient scheduled for excision of [facility] 11/03/22.  Lurlean Horns., RMA

## 2022-10-26 NOTE — Telephone Encounter (Signed)
-----   Message from Alfonso Patten, MD sent at 10/26/2022  3:28 PM EST ----- 1. Skin , right upper arm BASAL CELL CARCINOMA, SUPERFICIAL AND NODULAR PATTERNS --> recommend ED&C vs excision. Can schedule in next 2-3 weeks if available and can discuss treatment of mouth at that time.  ED&C has about an 85% cure rate and leaves a round wound the size of the skin cancer which is healed with ointment and a bandage over a few weeks time. It leaves a round white scar. No additional pathology is done. If the skin cancer were to come back, we would need to do a surgery to remove it.   Excision involves cutting out the spot with an area of normal looking skin around it followed by closing it with stitches. The cure rate is approximately 92-93%. It leaves a line scar, and you must take it easy for two weeks after surgery (no lifting over 10-15 lbs, avoid activity to get your heart rate and blood pressure up). There is a slightly higher risk of infection, bleeding or the wound opening up with this compared to the scrape and burn.  2. Skin , lower ant buccal mucosa LICHENOID DERMATITIS, most c/w oral lichen planus given exam and pathology. No sign of cancer.  Let area heal and will discuss treatment at follow-up. Will most likely switch to a different swish and spit treatment.  MAs please call.  Let me know if he has concerns for me or questions for me in the meantime.  Thank you!

## 2022-11-03 ENCOUNTER — Ambulatory Visit (INDEPENDENT_AMBULATORY_CARE_PROVIDER_SITE_OTHER): Payer: 59 | Admitting: Dermatology

## 2022-11-03 DIAGNOSIS — C44612 Basal cell carcinoma of skin of right upper limb, including shoulder: Secondary | ICD-10-CM | POA: Diagnosis not present

## 2022-11-03 DIAGNOSIS — L280 Lichen simplex chronicus: Secondary | ICD-10-CM

## 2022-11-03 NOTE — Patient Instructions (Addendum)
Wound Care Instructions  Cleanse wound gently with soap and water once a day then pat dry with clean gauze. Apply a thin coat of Petrolatum (petroleum jelly, "Vaseline") over the wound (unless you have an allergy to this). We recommend that you use a new, sterile tube of Vaseline. Do not pick or remove scabs. Do not remove the yellow or white "healing tissue" from the base of the wound.  Cover the wound with fresh, clean, nonstick gauze and secure with paper tape. You may use Band-Aids in place of gauze and tape if the wound is small enough, but would recommend trimming much of the tape off as there is often too much. Sometimes Band-Aids can irritate the skin.  You should call the office for your biopsy report after 1 week if you have not already been contacted.  If you experience any problems, such as abnormal amounts of bleeding, swelling, significant bruising, significant pain, or evidence of infection, please call the office immediately.  FOR ADULT SURGERY PATIENTS: If you need something for pain relief you may take 1 extra strength Tylenol (acetaminophen) AND 2 Ibuprofen (200mg each) together every 4 hours as needed for pain. (do not take these if you are allergic to them or if you have a reason you should not take them.) Typically, you may only need pain medication for 1 to 3 days.     Due to recent changes in healthcare laws, you may see results of your pathology and/or laboratory studies on MyChart before the doctors have had a chance to review them. We understand that in some cases there may be results that are confusing or concerning to you. Please understand that not all results are received at the same time and often the doctors may need to interpret multiple results in order to provide you with the best plan of care or course of treatment. Therefore, we ask that you please give us 2 business days to thoroughly review all your results before contacting the office for clarification. Should  we see a critical lab result, you will be contacted sooner.   If You Need Anything After Your Visit  If you have any questions or concerns for your doctor, please call our main line at [phone] and press option 4 to reach your doctor's medical assistant. If no one answers, please leave a voicemail as directed and we will return your call as soon as possible. Messages left after 4 pm will be answered the following business day.   You may also send us a message via MyChart. We typically respond to MyChart messages within 1-2 business days.  For prescription refills, please ask your pharmacy to contact our office. Our fax number is [phone].  If you have an urgent issue when the clinic is closed that cannot wait until the next business day, you can page your doctor at the number below.    Please note that while we do our best to be available for urgent issues outside of office hours, we are not available 24/7.   If you have an urgent issue and are unable to reach us, you may choose to seek medical care at your doctor's office, retail clinic, urgent care center, or emergency room.  If you have a medical emergency, please immediately call 911 or go to the emergency department.  Pager Numbers  - Dr. Kowalski: [phone]  - Dr. Moye: [phone]  - Dr. Stewart: [phone]  In the event of inclement weather, please call our main line at   [phone] for an update on the status of any delays or closures.  Dermatology Medication Tips: Please keep the boxes that topical medications come in in order to help keep track of the instructions about where and how to use these. Pharmacies typically print the medication instructions only on the boxes and not directly on the medication tubes.   If your medication is too expensive, please contact our office at [phone] option 4 or send us a message through MyChart.   We are unable to tell what your co-pay for medications will be in  advance as this is different depending on your insurance coverage. However, we may be able to find a substitute medication at lower cost or fill out paperwork to get insurance to cover a needed medication.   If a prior authorization is required to get your medication covered by your insurance company, please allow us 1-2 business days to complete this process.  Drug prices often vary depending on where the prescription is filled and some pharmacies may offer cheaper prices.  The website www.goodrx.com contains coupons for medications through different pharmacies. The prices here do not account for what the cost may be with help from insurance (it may be cheaper with your insurance), but the website can give you the price if you did not use any insurance.  - You can print the associated coupon and take it with your prescription to the pharmacy.  - You may also stop by our office during regular business hours and pick up a GoodRx coupon card.  - If you need your prescription sent electronically to a different pharmacy, notify our office through [facility] MyChart or by phone at [phone] option 4.     Si Usted Necesita Algo Despus de Su Visita  Tambin puede enviarnos un mensaje a travs de MyChart. Por lo general respondemos a los mensajes de MyChart en el transcurso de 1 a 2 das hbiles.  Para renovar recetas, por favor pida a su farmacia que se ponga en contacto con nuestra oficina. Nuestro nmero de fax es el [phone].  Si tiene un asunto urgente cuando la clnica est cerrada y que no puede esperar hasta el siguiente da hbil, puede llamar/localizar a su doctor(a) al nmero que aparece a continuacin.   Por favor, tenga en cuenta que aunque hacemos todo lo posible para estar disponibles para asuntos urgentes fuera del horario de oficina, no estamos disponibles las 24 horas del da, los 7 das de la semana.   Si tiene un problema urgente y no puede comunicarse con nosotros, puede  optar por buscar atencin mdica  en el consultorio de su doctor(a), en una clnica privada, en un centro de atencin urgente o en una sala de emergencias.  Si tiene una emergencia mdica, por favor llame inmediatamente al 911 o vaya a la sala de emergencias.  Nmeros de bper  - Dr. Kowalski: [phone]  - Dra. Moye: [phone]  - Dra. Stewart: [phone]  En caso de inclemencias del tiempo, por favor llame a nuestra lnea principal al [phone] para una actualizacin sobre el estado de cualquier retraso o cierre.  Consejos para la medicacin en dermatologa: Por favor, guarde las cajas en las que vienen los medicamentos de uso tpico para ayudarle a seguir las instrucciones sobre dnde y cmo usarlos. Las farmacias generalmente imprimen las instrucciones del medicamento slo en las cajas y no directamente en los tubos del medicamento.   Si su medicamento es muy caro, por favor, pngase en contacto con   nuestra oficina llamando al [phone] y presione la opcin 4 o envenos un mensaje a travs de MyChart.   No podemos decirle cul ser su copago por los medicamentos por adelantado ya que esto es diferente dependiendo de la cobertura de su seguro. Sin embargo, es posible que podamos encontrar un medicamento sustituto a menor costo o llenar un formulario para que el seguro cubra el medicamento que se considera necesario.   Si se requiere una autorizacin previa para que su compaa de seguros cubra su medicamento, por favor permtanos de 1 a 2 das hbiles para completar este proceso.  Los precios de los medicamentos varan con frecuencia dependiendo del lugar de dnde se surte la receta y alguna farmacias pueden ofrecer precios ms baratos.  El sitio web www.goodrx.com tiene cupones para medicamentos de diferentes farmacias. Los precios aqu no tienen en cuenta lo que podra costar con la ayuda del seguro (puede ser ms barato con su seguro), pero el sitio web puede darle el  precio si no utiliz ningn seguro.  - Puede imprimir el cupn correspondiente y llevarlo con su receta a la farmacia.  - Tambin puede pasar por nuestra oficina durante el horario de atencin regular y recoger una tarjeta de cupones de GoodRx.  - Si necesita que su receta se enve electrnicamente a una farmacia diferente, informe a nuestra oficina a travs de MyChart de [place] o por telfono llamando al [phone] y presione la opcin 4.  

## 2022-11-03 NOTE — Progress Notes (Signed)
Follow-Up Visit   Subjective  Caleb Reyes is a 57 y.o. male who presents for the following: Procedure (Patient here today for excision of bx proven BCC at right upper arm. ).   The following portions of the chart were reviewed this encounter and updated as appropriate:   Tobacco  Allergies  Meds  Problems  Med Hx  Surg Hx  Fam Hx      Review of Systems:  No other skin or systemic complaints except as noted in HPI or Assessment and Plan.  Objective  Well appearing patient in no apparent distress; mood and affect are within normal limits.  A focused examination was performed including right arm, mouth. Relevant physical exam findings are noted in the Assessment and Plan.  Right Upper Arm Pink bx site [phone]  lower ant buccal mucosa White reticulated slightly eroded plaque at lower mid buccal mucosa    Assessment & Plan  Basal cell carcinoma (BCC) of skin of right upper extremity including shoulder Right Upper Arm  Destruction of lesion  Destruction method: electrodesiccation and curettage   Informed consent: discussed and consent obtained   Timeout:  patient name, date of birth, surgical site, and procedure verified Anesthesia: the lesion was anesthetized in a standard fashion   Anesthetic:  1% lidocaine w/ epinephrine 1-100,000 buffered w/ 8.4% NaHCO3 Curettage performed in three different directions: Yes   Electrodesiccation performed over the curetted area: Yes   Curettage cycles:  3 Final wound size (cm):  1.2 Hemostasis achieved with:  electrodesiccation Outcome: patient tolerated procedure well with no complications   Post-procedure details: sterile dressing applied and wound care instructions given   Dressing type: petrolatum    Discussed excision vs ED&C.  ED&C has about an 85% cure rate and leaves a round wound the size of the skin cancer which is healed with ointment and a bandage over a few weeks time. It leaves a round white scar. No  additional pathology is done. If the skin cancer were to come back, we would need to do a surgery to remove it.   Excision involves cutting out the spot with an area of normal looking skin around it followed by closing it with stitches. The cure rate is approximately 92-93%. It leaves a line scar, and you must take it easy for two weeks after surgery (no lifting over 10-15 lbs, avoid activity to get your heart rate and blood pressure up). There is a slightly higher risk of infection, bleeding or the wound opening up with this compared to the scrape and burn.  Pt prefers ED&C today.  Lichenoid dermatitis lower ant buccal mucosa  Chronic and persistent condition with duration or expected duration over one year. Condition is bothersome/symptomatic for patient. Currently flared.  Exam and pathology together most consistent with oral lichen planus.  Discussed option of trial of clobetasol gel or tacrolimus swish and spit.  Patient prefers to trial triamcinolone dental paste a little longer as he has noticed some improvement.  Will recommend triamcinolone dental paste to dry mucosa twice a day with recheck in 1 month.  At that time if not clearing would recommend tacrolimus ointment or swish and spit.   Lichen planus is a chronic inflammatory condition of unknown cause that can involve skin or oral mucosa. It requires regular monitoring and treatment with topical steroids or other treatments to minimize inflammation.  Patients with erosive oral lichen planus have an increased risk of squamous cell carcinoma in the area. That risk is dramatically  reduced if inflammation is well-controlled. Please call if you notice any new or changing spots.    RTC 1 month  Graciella Belton, RMA, am acting as scribe for Forest Gleason, MD .   Documentation: I have reviewed the above documentation for accuracy and completeness, and I agree with the above.  Forest Gleason, MD

## 2022-11-05 ENCOUNTER — Encounter: Payer: Self-pay | Admitting: Dermatology

## 2022-11-08 ENCOUNTER — Telehealth: Payer: Self-pay

## 2022-11-08 DIAGNOSIS — L280 Lichen simplex chronicus: Secondary | ICD-10-CM

## 2022-11-08 NOTE — Telephone Encounter (Signed)
Patient called requesting a RX for a swiss and spit Dr Laurence Ferrari discussed at his last visit for his lichenoid dermatitis on the lower anterior buccal mucosa

## 2022-11-09 MED ORDER — TACROLIMUS 0.1 % EX OINT: TOPICAL_OINTMENT | Freq: Two times a day (BID) | CUTANEOUS | 0 refills | Status: AC

## 2022-11-09 NOTE — Telephone Encounter (Signed)
Spoke with patient and advised him we would send in tacrolimus to [facility] to use twice daily to affected area. Lurlean Horns., RMA

## 2022-11-09 NOTE — Addendum Note (Signed)
Addended by: Margarette Asal I on: 11/09/2022 05:38 PM   Modules accepted: Orders

## 2022-11-09 NOTE — Telephone Encounter (Signed)
Please let him know when I reviewed to get the recipe for the tacrolimus swish and spit, I saw some people were getting decent results with the tacrolimus ointment which is commercially available and can be put just on the area that is bothersome. Since this is localized to just a smaller area, I would recommend we try the tacrolimus ointment twice a day until follow-up to see how he does. I recommend getting it from [facility] in [place] (they ship for free) for best pricing. Thank you!

## 2022-11-30 ENCOUNTER — Other Ambulatory Visit: Payer: Self-pay

## 2022-11-30 ENCOUNTER — Ambulatory Visit: Payer: 59 | Admitting: Dermatology

## 2022-11-30 DIAGNOSIS — R051 Acute cough: Secondary | ICD-10-CM

## 2022-11-30 DIAGNOSIS — R5383 Other fatigue: Secondary | ICD-10-CM

## 2022-11-30 DIAGNOSIS — J0120 Acute ethmoidal sinusitis, unspecified: Secondary | ICD-10-CM

## 2022-11-30 LAB — POCT INFLUENZA A/B
Influenza A, POC: NEGATIVE
Influenza B, POC: NEGATIVE

## 2022-11-30 LAB — POC COVID19 BINAXNOW: SARS Coronavirus 2 Ag: NEGATIVE

## 2022-11-30 LAB — POCT RAPID STREP A (OFFICE): Rapid Strep A Screen: NEGATIVE

## 2022-11-30 NOTE — Progress Notes (Signed)
Stated fever, sore throat, congestion, drainage and feeling sick several days.  Tested for covid/flu/strept at request.  Stated not taking any allergy meds at this time and only using some Ibuprofen for feeling poorly.  Results of all tests negative given to pt and provider with OTC samples of Loratidine with instruction of daily use PRN with allergy season.

## 2022-12-06 ENCOUNTER — Encounter: Payer: Self-pay | Admitting: Physician Assistant

## 2022-12-06 ENCOUNTER — Ambulatory Visit: Payer: Self-pay | Admitting: Physician Assistant

## 2022-12-06 DIAGNOSIS — J301 Allergic rhinitis due to pollen: Secondary | ICD-10-CM

## 2022-12-06 MED ORDER — METHYLPREDNISOLONE 4 MG PO TBPK: ORAL_TABLET | ORAL | 0 refills | Status: DC

## 2022-12-06 MED ORDER — FEXOFENADINE-PSEUDOEPHED ER 60-120 MG PO TB12: 1.0000 | ORAL_TABLET | Freq: Two times a day (BID) | ORAL | 0 refills | Status: DC

## 2022-12-06 NOTE — Progress Notes (Signed)
   Subjective: Nasal congestion and rhinorrhea    Patient ID: Caleb Reyes, male    DOB: May 07, 1966, 57 y.o.   MRN: [id number]  HPI Patient complain 10 days of nasal congestion and rhinorrhea.  Patient tested negative for COVID-19 and influenza.  Patient currently using over-the-counter preparations with no noticeable results.  Denies fever/chills.  Denies cough.  Denies sore throat or chest pain.  No difficulty breathing.  Review of Systems Negative except for above complaint    Objective:   Physical Exam  Deferred secondary to this been a telephonic encounter.       Assessment & Plan: Allergic rhinitis   Patient had a prescription for Allegra-D and Medrol Dosepak.  Advised to follow-up in 3 days if no improvement or complaint.

## 2022-12-06 NOTE — Progress Notes (Signed)
Verified pharmacy and allergies.  Stated he has been feeling sick with cold/congestion and not taking any meds as he does not usually have allergies.  Negative testing last week for covid/flu/strept.  Request provider telehealth.

## 2022-12-30 ENCOUNTER — Ambulatory Visit (INDEPENDENT_AMBULATORY_CARE_PROVIDER_SITE_OTHER): Payer: Self-pay | Admitting: Dermatology

## 2022-12-30 VITALS — BP 137/88 | HR 67

## 2022-12-30 DIAGNOSIS — L578 Other skin changes due to chronic exposure to nonionizing radiation: Secondary | ICD-10-CM

## 2022-12-30 DIAGNOSIS — Z85828 Personal history of other malignant neoplasm of skin: Secondary | ICD-10-CM

## 2022-12-30 DIAGNOSIS — L439 Lichen planus, unspecified: Secondary | ICD-10-CM

## 2022-12-30 DIAGNOSIS — L438 Other lichen planus: Secondary | ICD-10-CM

## 2022-12-30 NOTE — Patient Instructions (Addendum)
Recommend Niacinamide or Nicotinamide  twice per day to lower risk of non-melanoma skin cancer by approximately 25%. This is usually available at [facility].  Due to recent changes in healthcare laws, you may see results of your pathology and/or laboratory studies on MyChart before the doctors have had a chance to review them. We understand that in some cases there may be results that are confusing or concerning to you. Please understand that not all results are received at the same time and often the doctors may need to interpret multiple results in order to provide you with the best plan of care or course of treatment. Therefore, we ask that you please give [place] 2 business days to thoroughly review all your results before contacting the office for clarification. Should we see a critical lab result, you will be contacted sooner.   If You Need Anything After Your Visit  If you have any questions or concerns for your doctor, please call our main line at [phone] and press option 4 to reach your doctor's medical assistant. If no one answers, please leave a voicemail as directed and we will return your call as soon as possible. Messages left after 4 pm will be answered the following business day.   You may also send [place] a message via MyChart. We typically respond to MyChart messages within 1-2 business days.  For prescription refills, please ask your pharmacy to contact our office. Our fax number is [phone].  If you have an urgent issue when the clinic is closed that cannot wait until the next business day, you can page your doctor at the number below.    Please note that while we do our best to be available for urgent issues outside of office hours, we are not available 24/7.   If you have an urgent issue and are unable to reach [place], you may choose to seek medical care at your doctor's office, retail clinic, urgent care center, or emergency room.  If you have a medical emergency,  please immediately call 911 or go to the emergency department.  Pager Numbers  - Dr. Gwen Pounds: [phone]  - Dr. Neale Burly: [phone]  - Dr. Roseanne Reno: [phone]  In the event of inclement weather, please call our main line at [phone] for an update on the status of any delays or closures.  Dermatology Medication Tips: Please keep the boxes that topical medications come in in order to help keep track of the instructions about where and how to use these. Pharmacies typically print the medication instructions only on the boxes and not directly on the medication tubes.   If your medication is too expensive, please contact our office at [phone] option 4 or send [place] a message through MyChart.   We are unable to tell what your co-pay for medications will be in advance as this is different depending on your insurance coverage. However, we may be able to find a substitute medication at lower cost or fill out paperwork to get insurance to cover a needed medication.   If a prior authorization is required to get your medication covered by your insurance company, please allow [place] 1-2 business days to complete this process.  Drug prices often vary depending on where the prescription is filled and some pharmacies may offer cheaper prices.  The website www.goodrx.com contains coupons for medications through different pharmacies. The prices here do not account for what the cost may be with help from insurance (it may be cheaper with your insurance), but  the website can give you the price if you did not use any insurance.  - You can print the associated coupon and take it with your prescription to the pharmacy.  - You may also stop by our office during regular business hours and pick up a GoodRx coupon card.  - If you need your prescription sent electronically to a different pharmacy, notify our office through [facility] or by phone at [phone] option 4.     Si Usted Necesita Algo  Despus de Su Visita  Tambin puede enviarnos un mensaje a travs de Pharmacist, community. Por lo general respondemos a los mensajes de MyChart en el transcurso de 1 a 2 das hbiles.  Para renovar recetas, por favor pida a su farmacia que se ponga en contacto con nuestra oficina. Harland Dingwall de fax es [place] [phone].  Si tiene un asunto urgente cuando la clnica est cerrada y que no puede esperar hasta el siguiente da hbil, puede llamar/localizar a su doctor(a) al nmero que aparece a continuacin.   Por favor, tenga en cuenta que aunque hacemos todo lo posible para estar disponibles para asuntos urgentes fuera del horario de [place], no estamos disponibles las 24 horas del da, los 7 das de la [place].   Si tiene un problema urgente y no puede comunicarse con nosotros, puede optar por buscar atencin mdica  en el consultorio de su doctor(a), en una clnica privada, en un centro de atencin urgente o en una sala de emergencias.  Si tiene Engineering geologist, por favor llame inmediatamente al 911 o vaya a la sala de emergencias.  Nmeros de bper  - Dr. Nehemiah Massed: [phone]  - Dra. Moye: [phone]  - Dra. Nicole Kindred: [phone]  En caso de inclemencias del [place], por favor llame a Johnsie Kindred principal al [phone] para una actualizacin sobre el [place] de cualquier retraso o cierre.  Consejos para la medicacin en dermatologa: Por favor, guarde las cajas en las que vienen los medicamentos de uso tpico para ayudarle a seguir las instrucciones sobre dnde y cmo usarlos. Las farmacias generalmente imprimen las instrucciones del medicamento slo en las cajas y no directamente en los tubos del [place].   Si su medicamento es muy caro, por favor, pngase en contacto con Zigmund Daniel llamando al [phone] y presione la opcin 4 o envenos un mensaje a travs de Pharmacist, community.   No podemos decirle cul ser su copago por los medicamentos por adelantado ya que esto es diferente  dependiendo de la cobertura de su seguro. Sin embargo, es posible que podamos encontrar un medicamento sustituto a Electrical engineer un formulario para que el seguro cubra el medicamento que se considera necesario.   Si se requiere una autorizacin previa para que su compaa de seguros [place] su medicamento, por favor permtanos de 1 a 2 das hbiles para completar este proceso.  Los precios de los medicamentos varan con frecuencia dependiendo del Environmental consultant de dnde se surte la receta y alguna farmacias pueden ofrecer precios ms baratos.  El sitio web www.goodrx.com tiene cupones para medicamentos de Airline pilot. Los precios aqu no tienen en cuenta lo que podra costar con la ayuda del seguro (puede ser ms barato con su seguro), pero el sitio web puede darle el precio si no utiliz Research scientist (physical sciences).  - Puede imprimir el cupn correspondiente y llevarlo con su receta a la farmacia.  - Tambin puede pasar por nuestra oficina durante el horario de atencin regular y Charity fundraiser una tarjeta de cupones de  GoodRx.  - Si necesita que su receta se enve electrnicamente a una farmacia diferente, informe a nuestra oficina a travs de MyChart de [place] o por telfono llamando al [phone] y presione la opcin 4.

## 2022-12-30 NOTE — Progress Notes (Signed)
   Follow-Up Visit   Subjective  Caleb Reyes is a 57 y.o. male who presents for the following: recheck BCC site of the R upper arm, patient states healing well. Recheck bx proven lichen planus, doing much better with Tacrolimus ointment daily.    The following portions of the chart were reviewed this encounter and updated as appropriate: medications, allergies, medical history  Review of Systems:  No other skin or systemic complaints except as noted in HPI or Assessment and Plan.  Objective  Well appearing patient in no apparent distress; mood and affect are within normal limits.  A focused examination was performed of the following areas: the face, lip, trunk, and R arm   Relevant exam findings are noted in the Assessment and Plan.  Lower lip mucosa Few faint reticulated white patches, no erosions    Assessment & Plan   ACTINIC DAMAGE - chronic, secondary to cumulative UV radiation exposure/sun exposure over time - diffuse scaly erythematous macules with underlying dyspigmentation - Recommend daily broad spectrum sunscreen SPF 30+ to sun-exposed areas, reapply every 2 hours as needed.  - Recommend staying in the shade or wearing long sleeves, sun glasses (UVA+UVB protection) and wide brim hats (4-inch brim around the entire circumference of the hat). - Call for new or changing lesions.  HISTORY OF BASAL CELL CARCINOMA OF THE SKIN - R upper arm - Thickened scar - No evidence of recurrence today - Recommend regular full body skin exams - Recommend daily broad spectrum sunscreen SPF 30+ to sun-exposed areas, reapply every 2 hours as needed.  - Call if any new or changing lesions are noted between office visits - Recommend Niacinamide or Nicotinamide  twice per day to lower risk of non-melanoma skin cancer by approximately 25%. This is usually available at [employer].   Oral lichen planus Lower lip mucosa  Bx proven - Chronic condition with duration or expected  duration over one year. Currently well-controlled.  Continue Tacrolimus 0.1% ointment to aa BID PRN.    Return for appointment as scheduled.  Maylene Roes, CMA, am acting as scribe for Darden Dates, MD .  Documentation: I have reviewed the above documentation for accuracy and completeness, and I agree with the above.  Darden Dates, MD

## 2023-04-14 ENCOUNTER — Ambulatory Visit (INDEPENDENT_AMBULATORY_CARE_PROVIDER_SITE_OTHER): Payer: 59 | Admitting: Dermatology

## 2023-04-14 VITALS — BP 121/81 | HR 69

## 2023-04-14 DIAGNOSIS — Z79899 Other long term (current) drug therapy: Secondary | ICD-10-CM

## 2023-04-14 DIAGNOSIS — D1801 Hemangioma of skin and subcutaneous tissue: Secondary | ICD-10-CM

## 2023-04-14 DIAGNOSIS — L438 Other lichen planus: Secondary | ICD-10-CM | POA: Diagnosis not present

## 2023-04-14 DIAGNOSIS — L578 Other skin changes due to chronic exposure to nonionizing radiation: Secondary | ICD-10-CM

## 2023-04-14 DIAGNOSIS — W908XXA Exposure to other nonionizing radiation, initial encounter: Secondary | ICD-10-CM

## 2023-04-14 DIAGNOSIS — L814 Other melanin hyperpigmentation: Secondary | ICD-10-CM

## 2023-04-14 DIAGNOSIS — Z85828 Personal history of other malignant neoplasm of skin: Secondary | ICD-10-CM

## 2023-04-14 DIAGNOSIS — L821 Other seborrheic keratosis: Secondary | ICD-10-CM

## 2023-04-14 DIAGNOSIS — L819 Disorder of pigmentation, unspecified: Secondary | ICD-10-CM

## 2023-04-14 DIAGNOSIS — L820 Inflamed seborrheic keratosis: Secondary | ICD-10-CM | POA: Diagnosis not present

## 2023-04-14 DIAGNOSIS — Z1283 Encounter for screening for malignant neoplasm of skin: Secondary | ICD-10-CM

## 2023-04-14 DIAGNOSIS — D229 Melanocytic nevi, unspecified: Secondary | ICD-10-CM

## 2023-04-14 NOTE — Progress Notes (Signed)
Follow-Up Visit   Subjective  Caleb Reyes is a 57 y.o. male who presents for the following: Skin Cancer Screening and Full Body Skin Exam, hx of BCC The patient presents for Total-Body Skin Exam (TBSE) for skin cancer screening and mole check. The patient has spots, moles and lesions to be evaluated, some may be new or changing and the patient may have concern these could be cancer.  The following portions of the chart were reviewed this encounter and updated as appropriate: medications, allergies, medical history  Review of Systems:  No other skin or systemic complaints except as noted in HPI or Assessment and Plan.  Objective  Well appearing patient in no apparent distress; mood and affect are within normal limits.  A full examination was performed including scalp, head, eyes, ears, nose, lips, neck, chest, axillae, abdomen, back, buttocks, bilateral upper extremities, bilateral lower extremities, hands, feet, fingers, toes, fingernails, and toenails. All findings within normal limits unless otherwise noted below.   Relevant physical exam findings are noted in the Assessment and Plan.  left mid brow x 1 Stuck-on, waxy, tan-brown papule -- Discussed benign etiology and prognosis.     Assessment & Plan   SKIN CANCER SCREENING PERFORMED TODAY.  Oral lichen planus Lower lip mucosa Bx proven - Chronic condition with duration or expected duration over one year. Currently well-controlled. Continue Tacrolimus 0.1% ointment to aa BID PRN.    ACTINIC DAMAGE - Chronic condition, secondary to cumulative UV/sun exposure - diffuse scaly erythematous macules with underlying dyspigmentation - Recommend daily broad spectrum sunscreen SPF 30+ to sun-exposed areas, reapply every 2 hours as needed.  - Staying in the shade or wearing long sleeves, sun glasses (UVA+UVB protection) and wide brim hats (4-inch brim around the entire circumference of the hat) are also recommended for sun  protection.  - Call for new or changing lesions.  LENTIGINES, SEBORRHEIC KERATOSES, HEMANGIOMAS - Benign normal skin lesions - Benign-appearing - Call for any changes  MELANOCYTIC NEVI - Tan-brown and/or pink-flesh-colored symmetric macules and papules - Benign appearing on exam today - Observation - Call clinic for new or changing moles - Recommend daily use of broad spectrum spf 30+ sunscreen to sun-exposed areas.   Inflamed seborrheic keratosis left mid brow x 1  Symptomatic, irritating, patient would like treated.   Destruction of lesion - left mid brow x 1 Complexity: simple   Destruction method: cryotherapy   Informed consent: discussed and consent obtained   Timeout:  patient name, date of birth, surgical site, and procedure verified Lesion destroyed using liquid nitrogen: Yes   Region frozen until ice ball extended beyond lesion: Yes   Outcome: patient tolerated procedure well with no complications   Post-procedure details: wound care instructions given    Skin cancer screening  Actinic skin damage  Seborrheic keratosis  Lentigo  Melanocytic nevus, unspecified location  History of basal cell carcinoma  Dyschromia   SCHAMBERG'S CHANGES  Left calf, left popliteal  Watch for changes   HISTORY OF BASAL CELL CARCINOMA OF THE SKIN - No evidence of recurrence today - Recommend regular full body skin exams - Recommend daily broad spectrum sunscreen SPF 30+ to sun-exposed areas, reapply every 2 hours as needed.  - Call if any new or changing lesions are noted between office visits   Return in about 1 year (around 04/13/2024) for TBSE, hx of BCC.  Cipriano Bunker, CMA, am acting as scribe for Armida Sans, MD .   Documentation: I have reviewed the  above documentation for accuracy and completeness, and I agree with the above.  Armida Sans, MD

## 2023-04-14 NOTE — Patient Instructions (Signed)

## 2023-04-19 ENCOUNTER — Encounter: Payer: Self-pay | Admitting: Dermatology

## 2023-04-21 ENCOUNTER — Encounter: Payer: 59 | Admitting: Dermatology

## 2023-05-24 ENCOUNTER — Ambulatory Visit: Payer: Self-pay

## 2023-05-24 DIAGNOSIS — Z0000 Encounter for general adult medical examination without abnormal findings: Secondary | ICD-10-CM

## 2023-05-24 NOTE — Progress Notes (Signed)
Updated Tdap booster at request after consent and tolerated well.  Denies any prior reaction to booster in past and afebrile and feeling well today.

## 2023-10-16 ENCOUNTER — Other Ambulatory Visit: Payer: Self-pay | Admitting: Dermatology

## 2023-10-16 DIAGNOSIS — L439 Lichen planus, unspecified: Secondary | ICD-10-CM

## 2023-11-08 DIAGNOSIS — E7800 Pure hypercholesterolemia, unspecified: Secondary | ICD-10-CM | POA: Insufficient documentation

## 2024-05-03 ENCOUNTER — Ambulatory Visit: Payer: 59 | Admitting: Dermatology

## 2024-05-22 ENCOUNTER — Encounter: Payer: Self-pay | Admitting: Dermatology

## 2024-05-22 ENCOUNTER — Ambulatory Visit (INDEPENDENT_AMBULATORY_CARE_PROVIDER_SITE_OTHER): Admitting: Dermatology

## 2024-05-22 DIAGNOSIS — D485 Neoplasm of uncertain behavior of skin: Secondary | ICD-10-CM | POA: Diagnosis not present

## 2024-05-22 DIAGNOSIS — D492 Neoplasm of unspecified behavior of bone, soft tissue, and skin: Secondary | ICD-10-CM

## 2024-05-22 DIAGNOSIS — L817 Pigmented purpuric dermatosis: Secondary | ICD-10-CM

## 2024-05-22 DIAGNOSIS — L820 Inflamed seborrheic keratosis: Secondary | ICD-10-CM

## 2024-05-22 DIAGNOSIS — W908XXA Exposure to other nonionizing radiation, initial encounter: Secondary | ICD-10-CM | POA: Diagnosis not present

## 2024-05-22 DIAGNOSIS — L578 Other skin changes due to chronic exposure to nonionizing radiation: Secondary | ICD-10-CM

## 2024-05-22 DIAGNOSIS — D225 Melanocytic nevi of trunk: Secondary | ICD-10-CM

## 2024-05-22 DIAGNOSIS — D239 Other benign neoplasm of skin, unspecified: Secondary | ICD-10-CM

## 2024-05-22 DIAGNOSIS — Z1283 Encounter for screening for malignant neoplasm of skin: Secondary | ICD-10-CM

## 2024-05-22 DIAGNOSIS — D229 Melanocytic nevi, unspecified: Secondary | ICD-10-CM

## 2024-05-22 DIAGNOSIS — L814 Other melanin hyperpigmentation: Secondary | ICD-10-CM

## 2024-05-22 DIAGNOSIS — Z79899 Other long term (current) drug therapy: Secondary | ICD-10-CM

## 2024-05-22 DIAGNOSIS — Z85828 Personal history of other malignant neoplasm of skin: Secondary | ICD-10-CM

## 2024-05-22 DIAGNOSIS — L219 Seborrheic dermatitis, unspecified: Secondary | ICD-10-CM

## 2024-05-22 DIAGNOSIS — Z7189 Other specified counseling: Secondary | ICD-10-CM

## 2024-05-22 HISTORY — DX: Other benign neoplasm of skin, unspecified: D23.9

## 2024-05-22 MED ORDER — KETOCONAZOLE 2 % EX CREA: TOPICAL_CREAM | CUTANEOUS | 5 refills | Status: AC

## 2024-05-22 NOTE — Patient Instructions (Addendum)

## 2024-05-22 NOTE — Progress Notes (Signed)
 Follow-Up Visit   Subjective  Caleb Reyes is a 58 y.o. male who presents for the following: Skin Cancer Screening and Full Body Skin Exam  The patient presents for Total-Body Skin Exam (TBSE) for skin cancer screening and mole check. The patient has spots, moles and lesions to be evaluated, some may be new or changing and the patient may have concern these could be cancer.  The following portions of the chart were reviewed this encounter and updated as appropriate: medications, allergies, medical history  Review of Systems:  No other skin or systemic complaints except as noted in HPI or Assessment and Plan.  Objective  Well appearing patient in no apparent distress; mood and affect are within normal limits.  A full examination was performed including scalp, head, eyes, ears, nose, lips, neck, chest, axillae, abdomen, back, buttocks, bilateral upper extremities, bilateral lower extremities, hands, feet, fingers, toes, fingernails, and toenails. All findings within normal limits unless otherwise noted below.   Relevant physical exam findings are noted in the Assessment and Plan.  left low back paraspinal 0.5 cm irregular brown macule   left low back lateral 10 cm lateral to spine 0.6 cm irregular brown macule  back x 3 Stuck-on, waxy, tan-brown papules and plaques -- Discussed benign etiology and prognosis.      Assessment & Plan   SKIN CANCER SCREENING PERFORMED TODAY.  ACTINIC DAMAGE - Chronic condition, secondary to cumulative UV/sun exposure - diffuse scaly erythematous macules with underlying dyspigmentation - Recommend daily broad spectrum sunscreen SPF 30+ to sun-exposed areas, reapply every 2 hours as needed.  - Staying in the shade or wearing long sleeves, sun glasses (UVA+UVB protection) and wide brim hats (4-inch brim around the entire circumference of the hat) are also recommended for sun protection.  - Call for new or changing lesions.  LENTIGINES,  SEBORRHEIC KERATOSES, HEMANGIOMAS - Benign normal skin lesions - Benign-appearing - Call for any changes  MELANOCYTIC NEVI - Tan-brown and/or pink-flesh-colored symmetric macules and papules - Benign appearing on exam today - Observation - Call clinic for new or changing moles - Recommend daily use of broad spectrum spf 30+ sunscreen to sun-exposed areas.   HISTORY OF BASAL CELL CARCINOMA OF THE SKIN - No evidence of recurrence today - Recommend regular full body skin exams - Recommend daily broad spectrum sunscreen SPF 30+ to sun-exposed areas, reapply every 2 hours as needed.  - Call if any new or changing lesions are noted between office visits   SEBORRHEIC DERMATITIS Exam: Pink patches with greasy scale-chest  Chronic and persistent condition with duration or expected duration over one year. Condition is symptomatic/ bothersome to patient. Not currently at goal.  Seborrheic Dermatitis is a chronic persistent rash characterized by pinkness and scaling most commonly of the mid face but also can occur on the scalp (dandruff), ears; mid chest, mid back and groin.  It tends to be exacerbated by stress and cooler weather.  People who have neurologic disease may experience new onset or exacerbation of existing seborrheic dermatitis.  The condition is not curable but treatable and can be controlled. Treatment Plan: Start Ketoconazole  2% cream apply to chest daily for 2 weeks, then decrease to 3 nights a week     Schamberg's purpura in site of previous sun burn  Left medial calf  Benign-appearing.  Observation.  Call clinic for new or changing lesions.  Recommend daily use of broad spectrum spf 30+ sunscreen to sun-exposed areas.   NEOPLASM OF SKIN (2) left low  back paraspinal Epidermal / dermal shaving  Lesion diameter (cm):  0.5 Informed consent: discussed and consent obtained   Timeout: patient name, date of birth, surgical site, and procedure verified   Procedure prep:  Patient was  prepped and draped in usual sterile fashion Prep type:  Isopropyl alcohol Anesthesia: the lesion was anesthetized in a standard fashion   Anesthetic:  1% lidocaine  w/ epinephrine 1-100,000 buffered w/ 8.4% NaHCO3 Hemostasis achieved with: pressure, aluminum chloride and electrodesiccation   Outcome: patient tolerated procedure well   Post-procedure details: sterile dressing applied and wound care instructions given   Dressing type: bandage and petrolatum    Specimen 1 - Surgical pathology Differential Diagnosis: R/O Dysplastic nevus   Check Margins: No left low back lateral 10 cm lateral to spine Epidermal / dermal shaving  Lesion diameter (cm):  0.6 Informed consent: discussed and consent obtained   Timeout: patient name, date of birth, surgical site, and procedure verified   Procedure prep:  Patient was prepped and draped in usual sterile fashion Prep type:  Isopropyl alcohol Anesthesia: the lesion was anesthetized in a standard fashion   Anesthetic:  1% lidocaine  w/ epinephrine 1-100,000 buffered w/ 8.4% NaHCO3 Hemostasis achieved with: pressure, aluminum chloride and electrodesiccation   Outcome: patient tolerated procedure well   Post-procedure details: sterile dressing applied and wound care instructions given   Dressing type: bandage and petrolatum    Specimen 2 - Surgical pathology Differential Diagnosis: R/O Dysplastic nevus   Check Margins: No INFLAMED SEBORRHEIC KERATOSIS back x 3 Symptomatic, irritating, patient would like treated.  Destruction of lesion - back x 3 Complexity: simple   Destruction method: cryotherapy   Informed consent: discussed and consent obtained   Timeout:  patient name, date of birth, surgical site, and procedure verified Lesion destroyed using liquid nitrogen: Yes   Region frozen until ice ball extended beyond lesion: Yes   Outcome: patient tolerated procedure well with no complications   Post-procedure details: wound care instructions  given      Return in about 1 year (around 05/22/2025) for TBSE, hx of BCC.  IFay Kirks, CMA, am acting as scribe for Alm Rhyme, MD .   Documentation: I have reviewed the above documentation for accuracy and completeness, and I agree with the above.  Alm Rhyme, MD

## 2024-05-23 LAB — SURGICAL PATHOLOGY

## 2024-05-24 ENCOUNTER — Encounter: Payer: Self-pay | Admitting: Dermatology

## 2024-05-24 ENCOUNTER — Ambulatory Visit: Payer: Self-pay | Admitting: Dermatology

## 2024-05-24 NOTE — Telephone Encounter (Signed)
 Advised pt of bx results/sh ?

## 2024-05-24 NOTE — Telephone Encounter (Signed)
-----   Message from Alm Rhyme sent at 05/24/2024  9:59 AM EDT ----- FINAL DIAGNOSIS        1. Skin, left low back paraspinal :       DYSPLASTIC COMPOUND NEVUS WITH MODERATE ATYPIA, LIMITED MARGINS FREE        2. Skin, left low back lateral 10 cm lateral to spine :       DYSPLASTIC COMPOUND NEVUS WITH MODERATE ATYPIA, LIMITED MARGINS FREE   1&2 both Moderate Dysplastic Recheck next visit ----- Message ----- From: Interface, [facility] Sent: 05/23/2024   6:11 PM EDT To: Alm JAYSON Rhyme, MD

## 2024-07-31 ENCOUNTER — Other Ambulatory Visit: Payer: Self-pay

## 2024-07-31 NOTE — Progress Notes (Signed)
 Reconciled problem list & medication list

## 2024-09-03 ENCOUNTER — Encounter: Admission: RE | Payer: Self-pay

## 2024-09-03 ENCOUNTER — Ambulatory Visit: Admitting: Anesthesiology

## 2024-09-03 ENCOUNTER — Encounter: Payer: Self-pay | Admitting: Gastroenterology

## 2024-09-03 ENCOUNTER — Ambulatory Visit
Admission: RE | Admit: 2024-09-03 | Discharge: 2024-09-03 | Disposition: A | Discharge status: Home / Self Care | Attending: Gastroenterology | Admitting: Gastroenterology

## 2024-09-03 DIAGNOSIS — K219 Gastro-esophageal reflux disease without esophagitis: Secondary | ICD-10-CM | POA: Diagnosis not present

## 2024-09-03 DIAGNOSIS — Z79899 Other long term (current) drug therapy: Secondary | ICD-10-CM | POA: Diagnosis not present

## 2024-09-03 DIAGNOSIS — D122 Benign neoplasm of ascending colon: Secondary | ICD-10-CM | POA: Insufficient documentation

## 2024-09-03 DIAGNOSIS — Z1211 Encounter for screening for malignant neoplasm of colon: Secondary | ICD-10-CM | POA: Diagnosis present

## 2024-09-03 DIAGNOSIS — Z79621 Long term (current) use of calcineurin inhibitor: Secondary | ICD-10-CM | POA: Insufficient documentation

## 2024-09-03 DIAGNOSIS — F17290 Nicotine dependence, other tobacco product, uncomplicated: Secondary | ICD-10-CM | POA: Diagnosis not present

## 2024-09-03 DIAGNOSIS — K5730 Diverticulosis of large intestine without perforation or abscess without bleeding: Secondary | ICD-10-CM | POA: Insufficient documentation

## 2024-09-03 HISTORY — PX: POLYPECTOMY: SHX149

## 2024-09-03 HISTORY — PX: HEMOSTASIS CLIP PLACEMENT: SHX6857

## 2024-09-03 HISTORY — PX: COLONOSCOPY: SHX5424

## 2024-09-03 SURGERY — COLONOSCOPY
Anesthesia: General

## 2024-09-03 MED ORDER — SODIUM CHLORIDE 0.9 % IV SOLN
INTRAVENOUS | Status: DC
  Administered 2024-09-03: 20 mL/h via INTRAVENOUS

## 2024-09-03 MED ORDER — PROPOFOL 10 MG/ML IV BOLUS
INTRAVENOUS | Status: AC
  Filled 2024-09-03: qty 20

## 2024-09-03 MED ORDER — PROPOFOL 10 MG/ML IV BOLUS
INTRAVENOUS | Status: DC | PRN
  Administered 2024-09-03: 100 mg via INTRAVENOUS

## 2024-09-03 MED ORDER — PROPOFOL 500 MG/50ML IV EMUL
INTRAVENOUS | Status: DC | PRN
  Administered 2024-09-03: 150 ug/kg/min via INTRAVENOUS

## 2024-09-03 NOTE — Anesthesia Postprocedure Evaluation (Signed)
"   Anesthesia Post Note  Patient: Caleb Reyes  Procedure(s) Performed: COLONOSCOPY POLYPECTOMY, INTESTINE  Anesthesia Type: General Anesthetic complications: no   No notable events documented.   Last Vitals:  Vitals:   09/03/24 1010 09/03/24 1018  BP: 119/67 101/80  Pulse: 70 64  Resp: 18 15  Temp:    SpO2: 99% 100%    Last Pain:  Vitals:   09/03/24 1018  TempSrc:   PainSc: 0-No pain                 VAN STAVEREN,Jakira Mcfadden      "

## 2024-09-03 NOTE — Transfer of Care (Signed)
 Immediate Anesthesia Transfer of Care Note  Patient: Caleb Reyes  Procedure(s) Performed: COLONOSCOPY POLYPECTOMY, INTESTINE  Patient Location: PACU and Endoscopy Unit  Anesthesia Type:MAC  Level of Consciousness: awake, alert , oriented, and patient cooperative  Airway & Oxygen Therapy: Patient Spontanous Breathing  Post-op Assessment: Report given to RN, Post -op Vital signs reviewed and stable, and Patient moving all extremities X 4  Post vital signs: Reviewed and stable  Last Vitals:  Vitals Value Taken Time  BP    Temp 35.9 C 09/03/24 09:58  Pulse 83 09/03/24 09:59  Resp 22 09/03/24 09:59  SpO2 99 % 09/03/24 09:59  Vitals shown include unfiled device data.  Last Pain:  Vitals:   09/03/24 0958  TempSrc: Temporal  PainSc:          Complications: No notable events documented.

## 2024-09-03 NOTE — H&P (Signed)
 "  Caleb Reyes , MD [address], [place], [place], [address] Phone: [phone] Fax: [phone]  Primary Care Physician:  Diedra Lame, MD   Pre-Procedure History & Physical: HPI:  Caleb Reyes is a 58 y.o. male is here for an colonoscopy.   Past Medical History:  Diagnosis Date   Basal cell carcinoma 11/30/2019   left lower back   Basal cell carcinoma 10/06/2018   left neck   Basal cell carcinoma 04/08/2022   Left chest. Superficial.  EDC   BCC (basal cell carcinoma) 10/21/2022   right upper arm, EDC 11/03/22   Dysplastic nevus 05/22/2024   left low back lateral 10 cm lateral to spine, moderate atypia   Dysplastic nevus 05/22/2024   left low back paraspinal, moderate atypia   GERD (gastroesophageal reflux disease)    no issues in over 5 yrs   Wears contact lenses     Past Surgical History:  Procedure Laterality Date   COLONOSCOPY     COLONOSCOPY WITH PROPOFOL  N/A 10/30/2015   Procedure: COLONOSCOPY WITH PROPOFOL ;  Surgeon: Rogelia Copping, MD;  Location: [facility];  Service: Endoscopy;  Laterality: N/A;  PER PT AND GINGER PT WANTS TO ARRIVE AFTER 10   COLONOSCOPY WITH PROPOFOL  N/A 09/09/2020   Procedure: COLONOSCOPY WITH PROPOFOL ;  Surgeon: Copping Rogelia, MD;  Location: [facility];  Service: Endoscopy;  Laterality: N/A;   POLYPECTOMY  10/30/2015   Procedure: POLYPECTOMY;  Surgeon: Rogelia Copping, MD;  Location: [facility];  Service: Endoscopy;;    Prior to Admission medications  Medication Sig Start Date End Date Taking? Authorizing Provider  atorvastatin (LIPITOR) 10 MG tablet Take 10 mg by mouth daily.   Yes [provider]  buPROPion (WELLBUTRIN XL) 150 MG 24 hr tablet Take 150 mg by mouth daily. 10/18/22  Yes [provider]  Inulin 2 g CHEW Chew by mouth.   Yes [provider]  ketoconazole  (NIZORAL ) 2 % cream Apply rash on chest once a day for 2 weeks, then decrease to 3 days a week 05/22/24  Yes Hester Alm BROCKS, MD  Melatonin 10 MG TABS Take 10 mg by mouth.   Yes [provider]  Multiple Vitamin (MULTIVITAMIN) tablet Take 1 tablet by mouth daily.   Yes [provider]  tacrolimus  (PROTOPIC ) 0.1 % ointment Apply topically 2 (two) times daily. 11/09/22  Yes Moye, [place] , MD  triamcinolone  (KENALOG ) 0.1 % paste APPLY TO AFFECTED AREA PULLING DOWN LIP, DRYING AREA, AND APPLYING PASTE 1-2 TIMES A DAY BEST AT BEDTIME TO BE APPLIED 10/17/23  Yes Hester Alm BROCKS, MD  penciclovir  (DENAVIR ) 1 % cream Apply 1 application topically every 2 (two) hours. Patient not taking: Reported on 12/30/2022 09/15/21   Claudene Tanda POUR, PA-C    Allergies as of 08/27/2024 - Review Complete 05/22/2024  Allergen Reaction Noted   Sulfa antibiotics Rash 10/24/2015    No family history on file.  Social History   Socioeconomic History   Marital status: Married    Spouse name: Not on file   Number of children: Not on file   Years of education: Not on file   Highest education level: Not on file  Occupational History   Not on file  Tobacco Use   Smoking status: Some Days    Types: Cigars   Smokeless tobacco: Never   Tobacco comments:    1-2 cigars/wk  Vaping Use   Vaping status: Never Used  Substance and Sexual Activity  Alcohol use: Yes    Alcohol/week: 7.0 standard drinks of alcohol    Types: 7 Cans of beer per week   Drug use: Never   Sexual activity: Not on file  Other Topics Concern   Not on file  Social History Narrative   Not on file   Social Drivers of Health   Tobacco Use: High Risk (05/22/2024)   Patient History    Smoking Tobacco Use: Some Days    Smokeless Tobacco Use: Never    Passive Exposure: Not on file  Financial Resource Strain: Low Risk  (11/08/2023)   Received from [facility]   Overall Financial Resource Strain (CARDIA)    Difficulty of Paying Living Expenses: Not hard at all  Food Insecurity: No Food Insecurity (11/08/2023)   Received from  [facility]   Epic    Within the past 12 months, you worried that your food would run out before you got the money to buy more.: Never true    Within the past 12 months, the food you bought just didn't last and you didn't have money to get more.: Never true  Transportation Needs: No Transportation Needs (11/08/2023)   Received from [facility] - Transportation    In the past 12 months, has lack of transportation kept you from medical appointments or from getting medications?: No    Lack of Transportation (Non-Medical): No  Physical Activity: Not on file  Stress: Not on file  Social Connections: Not on file  Intimate Partner Violence: Not on file  Depression (EYV7-0): Not on file  Alcohol Screen: Not on file  Housing: Low Risk  (11/08/2023)   Received from [facility]   Epic    In the last 12 months, was there a time when you were not able to pay the mortgage or rent on time?: No    In the past 12 months, how many times have you moved where you were living?: 1    At any time in the past 12 months, were you homeless or living in a shelter (including now)?: No  Utilities: Not At Risk (11/08/2023)   Received from [facility] Utilities    Threatened with loss of utilities: No  Health Literacy: Not on file    Review of Systems: See HPI, otherwise negative ROS  Physical Exam: BP 104/82   Pulse 71   Temp (!) 96.2 F (35.7 C) (Temporal)   Resp 20   Ht 6' 1 (1.854 m)   Wt 82.5 kg   SpO2 100%   BMI 23.99 kg/m  General:   Alert,  pleasant and cooperative in NAD Head:  Normocephalic and atraumatic. Neck:  Supple; no masses or thyromegaly. Lungs:  Clear throughout to auscultation, normal respiratory effort.    Heart:  +S1, +S2, Regular rate and rhythm, No edema. Abdomen:  Soft, nontender and nondistended. Normal bowel sounds, without guarding, and without rebound.   Neurologic:  Alert and  oriented  x4;  grossly normal neurologically.  Impression/Plan: Caleb Reyes is here for an colonoscopy to be performed for surveillance due to prior history of colon polyps   Risks, benefits, limitations, and alternatives regarding  colonoscopy have been reviewed with the patient.  Questions have been answered.  All parties agreeable.   Caleb Kung, MD  09/03/2024, 8:47 AM  "

## 2024-09-03 NOTE — Op Note (Signed)
 [facility] Gastroenterology Patient Name: Caleb Reyes Procedure Date: 09/03/2024 9:27 AM MRN: [id number] Account #: [id number] Date of Birth: 12-08-1965 Admit Type: Outpatient Age: 58 Room: [facility] ENDO ROOM 2 Gender: Male Note Status: Finalized Instrument Name: Colon Scope [id number] Procedure:             Colonoscopy Indications:           Surveillance: Personal history of adenomatous polyps                         on last colonoscopy > 3 years ago, Last colonoscopy:                         December 2021 Providers:             Ruel Kung MD, MD Referring MD:          Jerona CHARLENA Sayre MD (Referring MD) Medicines:             Monitored Anesthesia Care Complications:         No immediate complications. Procedure:             Pre-Anesthesia Assessment:                        - Prior to the procedure, a History and Physical was                         performed, and patient medications, allergies and                         sensitivities were reviewed. The patient's tolerance                         of previous anesthesia was reviewed.                        - The risks and benefits of the procedure and the                         sedation options and risks were discussed with the                         patient. All questions were answered and informed                         consent was obtained.                        - ASA Grade Assessment: II - A patient with mild                         systemic disease.                        After obtaining informed consent, the colonoscope was                         passed under direct vision. Throughout the procedure,                         the  patient's blood pressure, pulse, and oxygen                         saturations were monitored continuously. The                         Colonoscope was introduced through the anus and                         advanced to the the cecum, identified by the                          appendiceal orifice. The colonoscopy was performed                         with ease. The patient tolerated the procedure well.                         The quality of the bowel preparation was excellent.                         The ileocecal valve, appendiceal orifice, and rectum                         were photographed. Findings:      The perianal and digital rectal examinations were normal.      Two sessile polyps were found in the ascending colon. The polyps were 4       to 5 mm in size. These polyps were removed with a cold snare. Resection       and retrieval were complete. To prevent bleeding post-intervention, one       hemostatic clip was successfully placed. Clip manufacturer: [employer]. There was no bleeding at the end of the procedure.      The exam was otherwise without abnormality on direct and retroflexion       views.      Multiple medium-mouthed diverticula were found in the sigmoid colon.      The exam was otherwise without abnormality on direct and retroflexion       views. Impression:            - Two 4 to 5 mm polyps in the ascending colon, removed                         with a cold snare. Resected and retrieved. Clip was                         placed. Clip manufacturer: [employer].                        - The examination was otherwise normal on direct and                         retroflexion views. Recommendation:        - Discharge patient to home (with escort).                        - Resume previous diet.                        -  Continue present medications.                        - Await pathology results.                        - Repeat colonoscopy in 5 years for surveillance based                         on pathology results. Procedure Code(s):     --- Professional ---                        [phone], Colonoscopy, flexible; with removal of                         tumor(s), polyp(s), or other lesion(s) by snare                          technique Diagnosis Code(s):     --- Professional ---                        Z86.010, Personal history of colonic polyps                        D12.2, Benign neoplasm of ascending colon CPT copyright 2022 [employer]. All rights reserved. The codes documented in this report are preliminary and upon coder review may  be revised to meet current compliance requirements. Ruel Kung, MD Ruel Kung MD, MD 09/03/2024 9:56:31 AM This report has been signed electronically. Number of Addenda: 0 Note Initiated On: 09/03/2024 9:27 AM Scope Withdrawal Time: 0 hours 10 minutes 49 seconds  Total Procedure Duration: 0 hours 13 minutes 31 seconds  Estimated Blood Loss:  Estimated blood loss: none.      [facility]

## 2024-09-03 NOTE — Anesthesia Preprocedure Evaluation (Signed)
 "                                  Anesthesia Evaluation  Patient identified by MRN, date of birth, ID band Patient awake    Reviewed: Allergy & Precautions, NPO status , Patient's Chart, lab work & pertinent test results  Airway Mallampati: II  TM Distance: >3 FB Neck ROM: Full    Dental  (+) Teeth Intact   Pulmonary neg pulmonary ROS, Current Smoker and Patient abstained from smoking.   Pulmonary exam normal        Cardiovascular negative cardio ROS Normal cardiovascular exam Rhythm:Regular Rate:Normal     Neuro/Psych negative neurological ROS  negative psych ROS   GI/Hepatic negative GI ROS, Neg liver ROS,GERD  Medicated,,  Endo/Other  negative endocrine ROS    Renal/GU negative Renal ROS  negative genitourinary   Musculoskeletal   Abdominal Normal abdominal exam  (+)   Peds negative pediatric ROS (+)  Hematology negative hematology ROS (+)   Anesthesia Other Findings Past Medical History: 11/30/2019: Basal cell carcinoma     Comment:  left lower back 10/06/2018: Basal cell carcinoma     Comment:  left neck 04/08/2022: Basal cell carcinoma     Comment:  Left chest. Superficial.  EDC 10/21/2022: BCC (basal cell carcinoma)     Comment:  right upper arm, [facility] 11/03/22 05/22/2024: Dysplastic nevus     Comment:  left low back lateral 10 cm lateral to spine, moderate               atypia 05/22/2024: Dysplastic nevus     Comment:  left low back paraspinal, moderate atypia No date: GERD (gastroesophageal reflux disease)     Comment:  no issues in over 5 yrs No date: Wears contact lenses  Past Surgical History: No date: COLONOSCOPY 10/30/2015: COLONOSCOPY WITH PROPOFOL ; N/A     Comment:  Procedure: COLONOSCOPY WITH PROPOFOL ;  Surgeon: Rogelia Copping, MD;  Location: [facility];  Service:               Endoscopy;  Laterality: N/A;  PER PT AND GINGER PT WANTS               TO ARRIVE AFTER 10 09/09/2020: COLONOSCOPY WITH  PROPOFOL ; N/A     Comment:  Procedure: COLONOSCOPY WITH PROPOFOL ;  Surgeon: Copping Rogelia, MD;  Location: [facility];  Service:               Endoscopy;  Laterality: N/A; 10/30/2015: POLYPECTOMY     Comment:  Procedure: POLYPECTOMY;  Surgeon: Rogelia Copping, MD;                Location: [facility];  Service: Endoscopy;;  BMI    Body Mass Index: 23.99 kg/m      Reproductive/Obstetrics negative OB ROS                              Anesthesia Physical Anesthesia Plan  ASA: 2  Anesthesia Plan: General   Post-op Pain Management:    Induction: Intravenous  PONV Risk Score and Plan: Propofol  infusion and TIVA  Airway Management Planned: Natural Airway and Nasal Cannula  Additional Equipment:   Intra-op  Plan:   Post-operative Plan:   Informed Consent: I have reviewed the patients History and Physical, chart, labs and discussed the procedure including the risks, benefits and alternatives for the proposed anesthesia with the patient or authorized representative who has indicated his/her understanding and acceptance.     Dental Advisory Given  Plan Discussed with: CRNA  Anesthesia Plan Comments:         Anesthesia Quick Evaluation  "

## 2024-09-04 LAB — SURGICAL PATHOLOGY

## 2024-09-09 ENCOUNTER — Ambulatory Visit: Payer: Self-pay | Admitting: Gastroenterology

## 2025-05-22 ENCOUNTER — Ambulatory Visit: Admitting: Dermatology
